# Patient Record
Sex: Female | Born: 1980
Health system: Southern US, Community
[De-identification: ages and names within clinical notes are randomized; demographics above are authoritative.]

## PROBLEM LIST (undated history)

## (undated) DIAGNOSIS — Z9851 Tubal ligation status: Secondary | ICD-10-CM

## (undated) DIAGNOSIS — F419 Anxiety disorder, unspecified: Secondary | ICD-10-CM

## (undated) DIAGNOSIS — Z98891 History of uterine scar from previous surgery: Secondary | ICD-10-CM

## (undated) DIAGNOSIS — Z789 Other specified health status: Secondary | ICD-10-CM

## (undated) DIAGNOSIS — G4733 Obstructive sleep apnea (adult) (pediatric): Secondary | ICD-10-CM

## (undated) HISTORY — DX: Anxiety disorder, unspecified: F41.9

## (undated) HISTORY — DX: Obstructive sleep apnea (adult) (pediatric): G47.33

---

## 2008-03-07 HISTORY — PX: TYMPANOPLASTY: SHX33

## 2009-10-17 ENCOUNTER — Inpatient Hospital Stay (HOSPITAL_COMMUNITY): Admission: AD | Admit: 2009-10-17 | Discharge: 2009-10-17 | Payer: Self-pay | Admitting: Obstetrics & Gynecology

## 2009-10-17 ENCOUNTER — Ambulatory Visit: Payer: Self-pay | Admitting: Obstetrics and Gynecology

## 2010-05-21 LAB — CBC
Hemoglobin: 12.9 g/dL (ref 12.0–15.0)
MCHC: 32.7 g/dL (ref 30.0–36.0)
RBC: 4.5 MIL/uL (ref 3.87–5.11)

## 2010-05-21 LAB — URINALYSIS, ROUTINE W REFLEX MICROSCOPIC
Glucose, UA: NEGATIVE mg/dL
Ketones, ur: NEGATIVE mg/dL
Leukocytes, UA: NEGATIVE
Nitrite: NEGATIVE
Specific Gravity, Urine: 1.025 (ref 1.005–1.030)
Urobilinogen, UA: 0.2 mg/dL (ref 0.0–1.0)
pH: 6.5 (ref 5.0–8.0)

## 2010-05-21 LAB — WET PREP, GENITAL
Trich, Wet Prep: NONE SEEN
Yeast Wet Prep HPF POC: NONE SEEN

## 2010-06-04 ENCOUNTER — Other Ambulatory Visit: Payer: Self-pay | Admitting: Family Medicine

## 2010-06-04 DIAGNOSIS — R51 Headache: Secondary | ICD-10-CM

## 2010-06-11 ENCOUNTER — Ambulatory Visit
Admission: RE | Admit: 2010-06-11 | Discharge: 2010-06-11 | Disposition: A | Discharge status: Home / Self Care | Payer: Managed Care, Other (non HMO) | Source: Ambulatory Visit | Attending: Family Medicine | Admitting: Family Medicine

## 2010-06-11 DIAGNOSIS — R51 Headache: Secondary | ICD-10-CM

## 2010-06-11 MED ORDER — IOHEXOL 300 MG/ML  SOLN
75.0000 mL | Freq: Once | INTRAMUSCULAR | Status: AC | PRN
  Administered 2010-06-11: 75 mL via INTRAVENOUS

## 2011-04-25 LAB — PULMONARY FUNCTION TEST

## 2011-11-28 LAB — OB RESULTS CONSOLE ABO/RH: RH Type: POSITIVE

## 2011-11-28 LAB — OB RESULTS CONSOLE RPR: RPR: NONREACTIVE

## 2011-11-28 LAB — OB RESULTS CONSOLE HIV ANTIBODY (ROUTINE TESTING): HIV: NONREACTIVE

## 2012-01-28 IMAGING — CT CT HEAD WO/W CM
1 of 2 series · 14 of 30 positions shown, 18 images · IV contrast (75CC OMNI 300)
Comparison: None.

CLINICAL DATA: 1 month persistent left temporal headache with left
mandibular pain/numbness.  Bell's palsy.

CT HEAD WITHOUT AND WITH CONTRAST
TECHNIQUE: Contiguous axial images were obtained from the base of
the skull through the vertex without and with intravenous contrast.
Contrast: Pre and post intravenous 75 ml [id number]-[id number].

[Series 2: head w/o · axial · non-contrast · 0.43mm/px · z∈[+23,+147]mm · 14 of 32 slices shown, 18 images]
[im 3/32  brain]
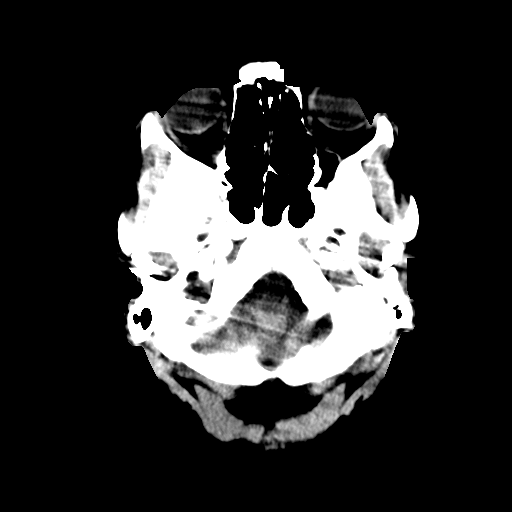
[im 3/32  bone]
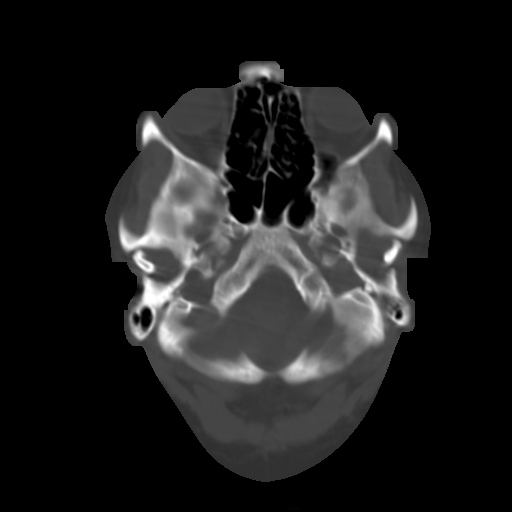
[im 5/32  brain]
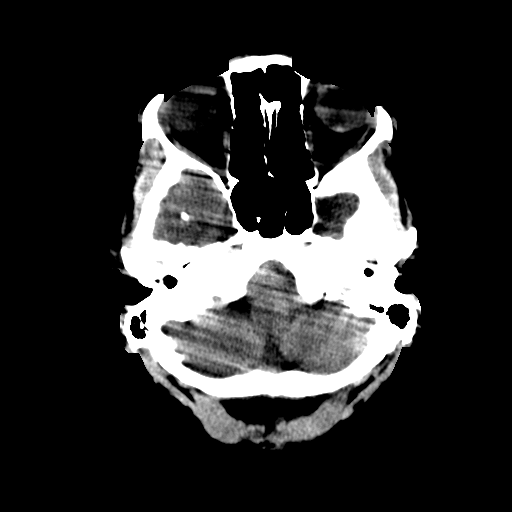
[im 7/32  brain]
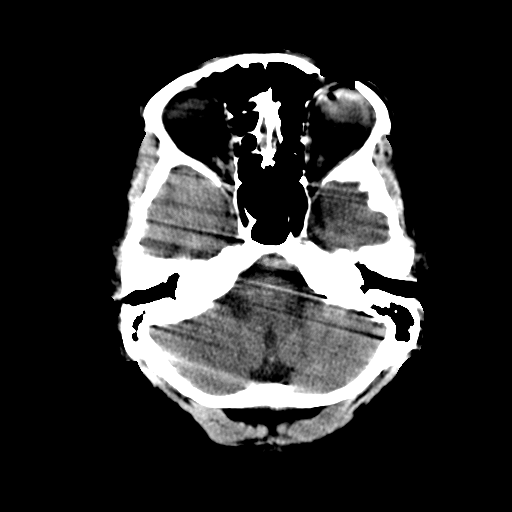
[im 9/32  brain]
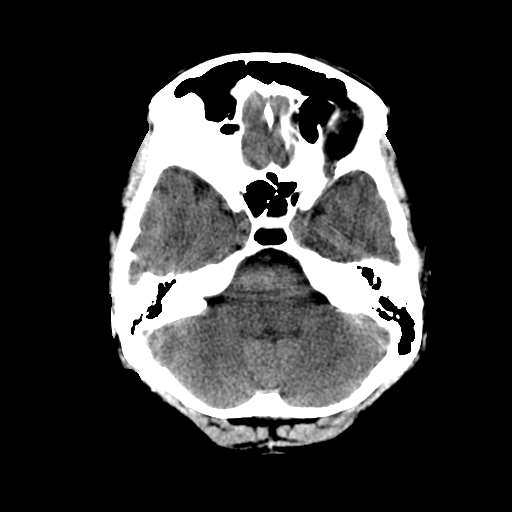
[im 11/32  brain]
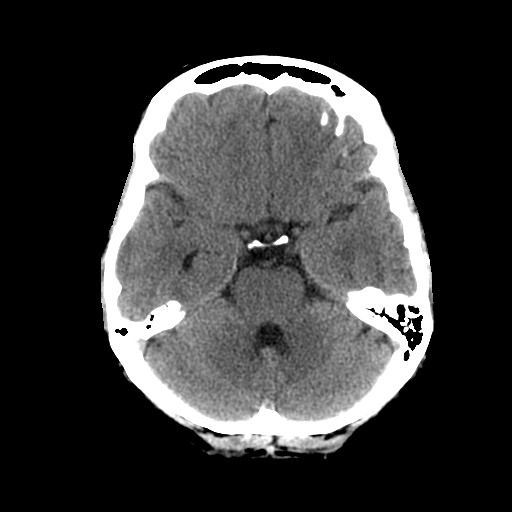
[im 11/32  bone]
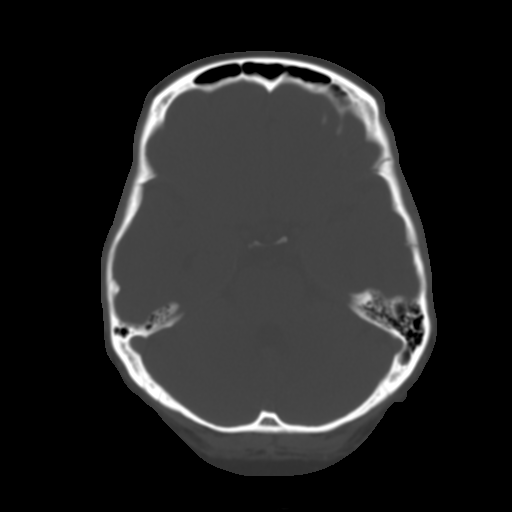
[im 13/32  brain]
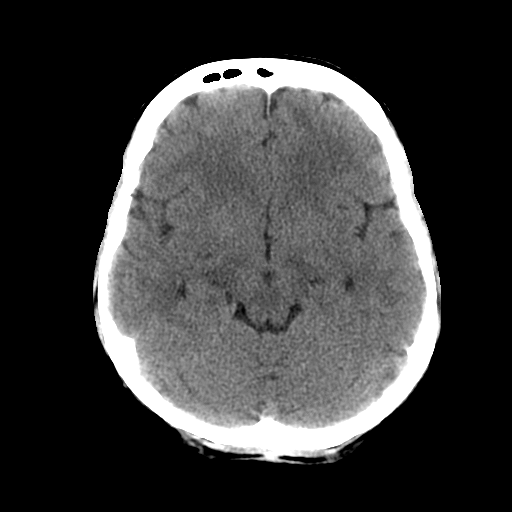
[im 15/32  brain]
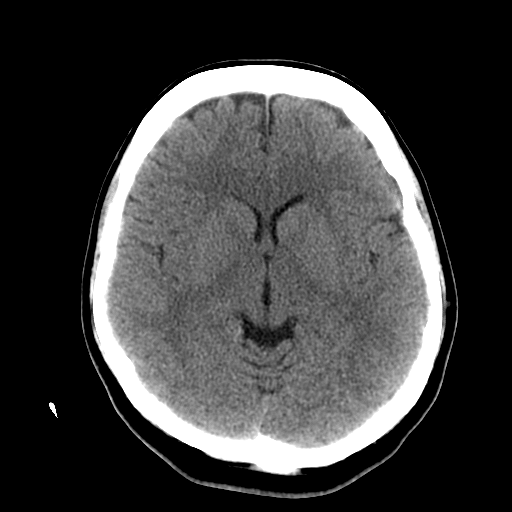
[im 17/32  brain]
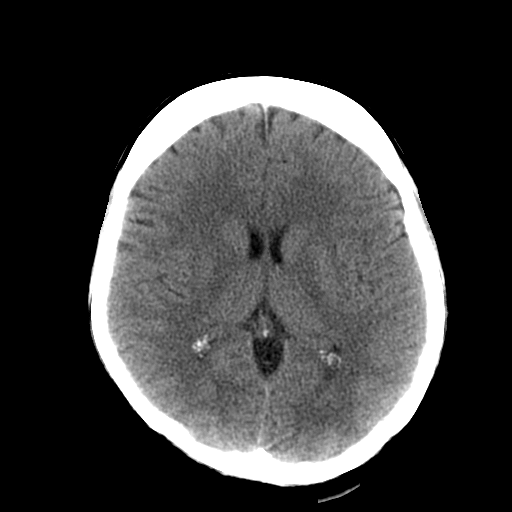
[im 19/32  brain]
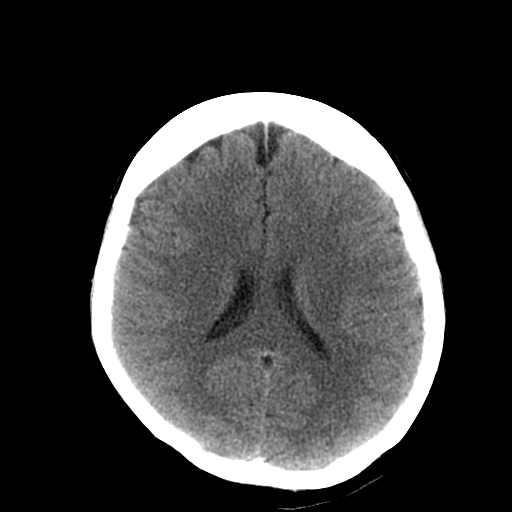
[im 19/32  bone]
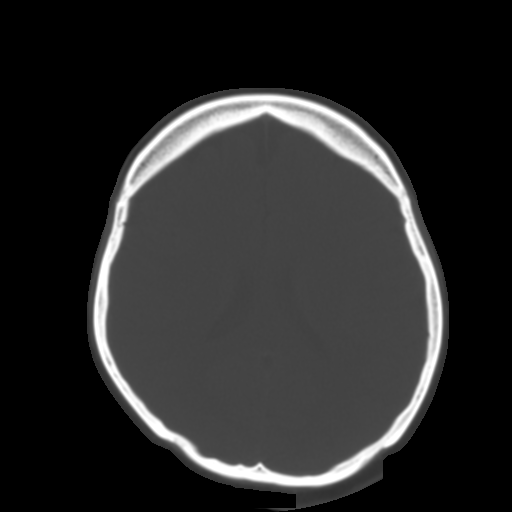
[im 21/32  brain]
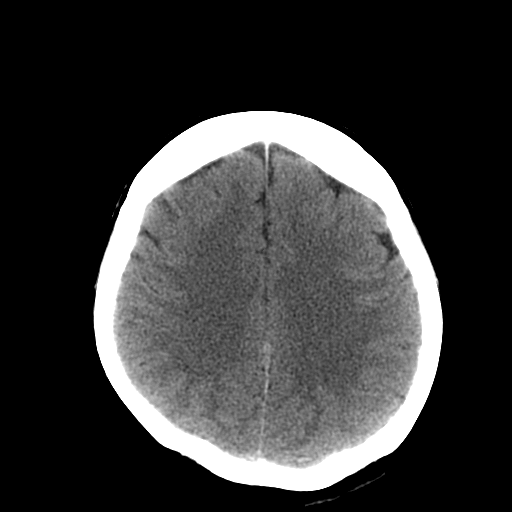
[im 23/32  brain]
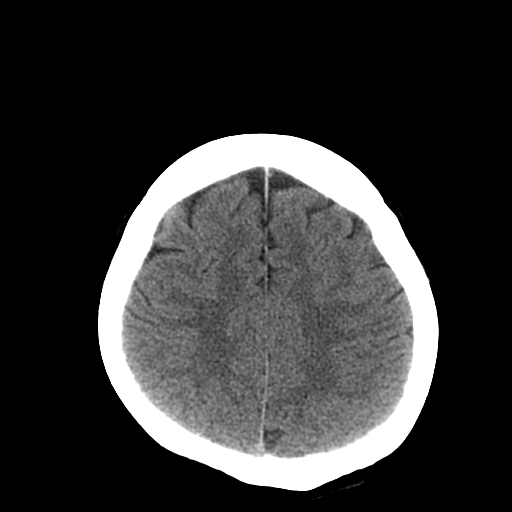
[im 25/32  brain]
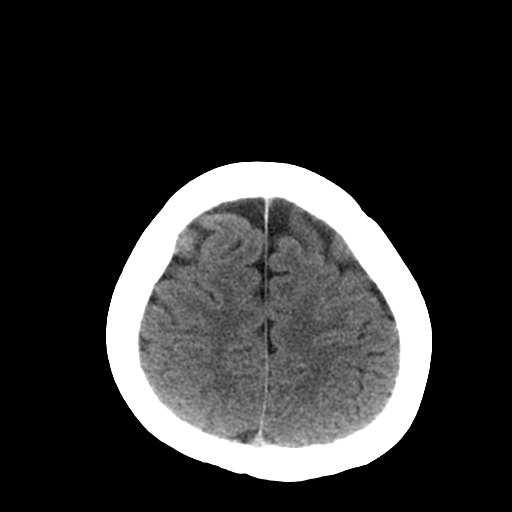
[im 27/32  brain]
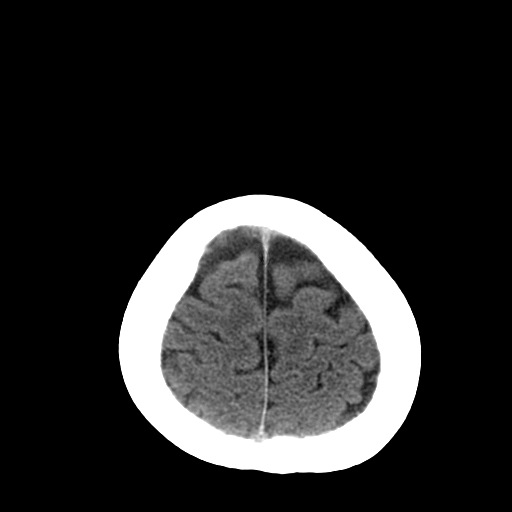
[im 27/32  bone]
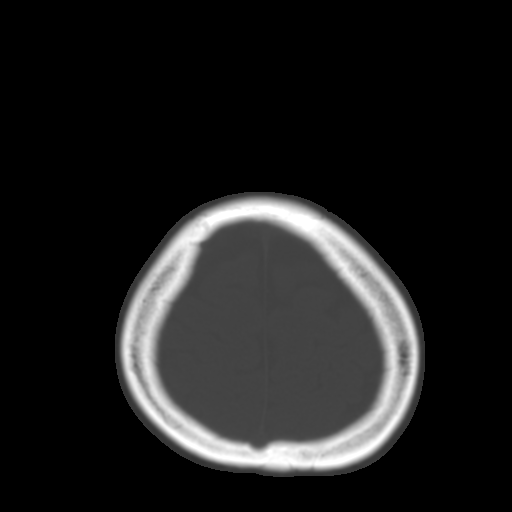
[im 29/32  brain]
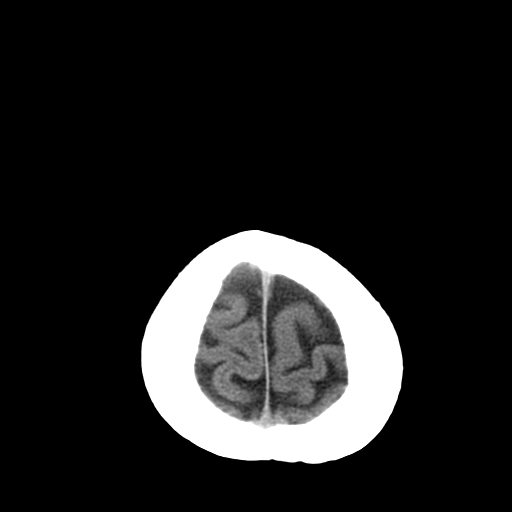

[14 of 30 positions shown; findings below may reference images not displayed]

FINDINGS: Cerebrum, cerebral ventricles, brainstem, and cerebellum
appear normal. (Specifically no acute intracerebral edema,
hemorrhage, tumor, mass effect/midline shift, acute extra-axial
hemorrhage/collection).

Vasculature unremarkable.

Calvarium appears normal.
IMPRESSION: Normal.

## 2012-05-03 ENCOUNTER — Other Ambulatory Visit: Payer: Self-pay | Admitting: Obstetrics & Gynecology

## 2012-05-31 ENCOUNTER — Encounter (HOSPITAL_COMMUNITY): Payer: Self-pay | Admitting: Pharmacist

## 2012-06-06 ENCOUNTER — Encounter (HOSPITAL_COMMUNITY): Payer: Self-pay

## 2012-06-06 ENCOUNTER — Encounter (HOSPITAL_COMMUNITY)
Admission: RE | Admit: 2012-06-06 | Discharge: 2012-06-06 | Disposition: A | Discharge status: Home / Self Care | Payer: Managed Care, Other (non HMO) | Source: Ambulatory Visit | Attending: Obstetrics & Gynecology | Admitting: Obstetrics & Gynecology

## 2012-06-06 HISTORY — DX: Other specified health status: Z78.9

## 2012-06-06 LAB — CBC
HCT: 38.3 % (ref 36.0–46.0)
MCHC: 32.6 g/dL (ref 30.0–36.0)
MCV: 89.1 fL (ref 78.0–100.0)
WBC: 7.8 10*3/uL (ref 4.0–10.5)

## 2012-06-06 LAB — TYPE AND SCREEN: ABO/RH(D): B POS

## 2012-06-06 LAB — ABO/RH: ABO/RH(D): B POS

## 2012-06-06 NOTE — Patient Instructions (Addendum)
20 [name]  06/06/2012   Your procedure is scheduled on:  06/08/12  Enter through the Main Entrance of [facility] at 1130 AM.  Pick up the phone at the desk and dial [phone].   Call this number if you have problems the morning of surgery: [phone]   Remember:   Do not eat food:After Midnight.  Do not drink clear liquids: 4 Hours before arrival.  Take these medicines the morning of surgery with A SIP OF WATER: NA   Do not wear jewelry, make-up or nail polish.  Do not wear lotions, powders, or perfumes. You may wear deodorant.  Do not shave 48 hours prior to surgery.  Do not bring valuables to the hospital.  Contacts, dentures or bridgework may not be worn into surgery.  Leave suitcase in the car. After surgery it may be brought to your room.  For patients admitted to the hospital, checkout time is 11:00 AM the day of discharge.   Patients discharged the day of surgery will not be allowed to drive home.  Name and phone number of your driver: NA  Special Instructions: Shower using CHG 2 nights before surgery and the night before surgery.  If you shower the day of surgery use CHG.  Use special wash - you have one bottle of CHG for all showers.  You should use approximately 1/3 of the bottle for each shower.   Please read over the following fact sheets that you were given: Surgical Site Infection Prevention

## 2012-06-08 ENCOUNTER — Inpatient Hospital Stay (HOSPITAL_COMMUNITY): Payer: Managed Care, Other (non HMO) | Admitting: Anesthesiology

## 2012-06-08 ENCOUNTER — Encounter (HOSPITAL_COMMUNITY): Payer: Self-pay | Admitting: *Deleted

## 2012-06-08 ENCOUNTER — Encounter (HOSPITAL_COMMUNITY)
Admission: RE | Disposition: A | Discharge status: Home / Self Care | Payer: Self-pay | Source: Ambulatory Visit | Attending: Obstetrics & Gynecology

## 2012-06-08 ENCOUNTER — Inpatient Hospital Stay (HOSPITAL_COMMUNITY)
Admission: RE | Admit: 2012-06-08 | Discharge: 2012-06-11 | DRG: 766 | Disposition: A | Discharge status: Home / Self Care | Payer: Managed Care, Other (non HMO) | Source: Ambulatory Visit | Attending: Obstetrics & Gynecology | Admitting: Obstetrics & Gynecology

## 2012-06-08 ENCOUNTER — Encounter (HOSPITAL_COMMUNITY): Payer: Self-pay | Admitting: Anesthesiology

## 2012-06-08 DIAGNOSIS — Z98891 History of uterine scar from previous surgery: Secondary | ICD-10-CM

## 2012-06-08 DIAGNOSIS — O30039 Twin pregnancy, monochorionic/diamniotic, unspecified trimester: Secondary | ICD-10-CM

## 2012-06-08 DIAGNOSIS — R4182 Altered mental status, unspecified: Secondary | ICD-10-CM | POA: Diagnosis not present

## 2012-06-08 DIAGNOSIS — Z91040 Latex allergy status: Secondary | ICD-10-CM

## 2012-06-08 DIAGNOSIS — O34219 Maternal care for unspecified type scar from previous cesarean delivery: Principal | ICD-10-CM | POA: Diagnosis present

## 2012-06-08 DIAGNOSIS — Z302 Encounter for sterilization: Secondary | ICD-10-CM

## 2012-06-08 DIAGNOSIS — Z9851 Tubal ligation status: Secondary | ICD-10-CM

## 2012-06-08 DIAGNOSIS — O3090 Multiple gestation, unspecified, unspecified trimester: Secondary | ICD-10-CM | POA: Diagnosis present

## 2012-06-08 DIAGNOSIS — O329XX0 Maternal care for malpresentation of fetus, unspecified, not applicable or unspecified: Secondary | ICD-10-CM | POA: Diagnosis present

## 2012-06-08 DIAGNOSIS — O99893 Other specified diseases and conditions complicating puerperium: Secondary | ICD-10-CM | POA: Diagnosis not present

## 2012-06-08 HISTORY — DX: History of uterine scar from previous surgery: Z98.891

## 2012-06-08 HISTORY — DX: Tubal ligation status: Z98.51

## 2012-06-08 LAB — COMPREHENSIVE METABOLIC PANEL
ALT: 13 U/L (ref 0–35)
AST: 24 U/L (ref 0–37)
Alkaline Phosphatase: 139 U/L — ABNORMAL HIGH (ref 39–117)
CO2: 24 mEq/L (ref 19–32)
Chloride: 98 mEq/L (ref 96–112)
GFR calc Af Amer: >90 mL/min (ref >90)
GFR calc non Af Amer: >90 mL/min (ref >90)
Glucose, Bld: 84 mg/dL (ref 70–99)
Potassium: 4.3 mEq/L (ref 3.5–5.1)
Sodium: 127 mEq/L — ABNORMAL LOW (ref 135–145)
Total Bilirubin: 0.4 mg/dL (ref 0.3–1.2)

## 2012-06-08 LAB — CBC
Hemoglobin: 11.1 g/dL — ABNORMAL LOW (ref 12.0–15.0)
MCH: 29.4 pg (ref 26.0–34.0)
Platelets: 123 10*3/uL — ABNORMAL LOW (ref 150–400)
RBC: 3.77 MIL/uL — ABNORMAL LOW (ref 3.87–5.11)
WBC: 10.6 10*3/uL — ABNORMAL HIGH (ref 4.0–10.5)

## 2012-06-08 SURGERY — Surgical Case
Anesthesia: Spinal | Site: Abdomen | Wound class: Clean Contaminated

## 2012-06-08 MED ORDER — PHENYLEPHRINE 40 MCG/ML (10ML) SYRINGE FOR IV PUSH (FOR BLOOD PRESSURE SUPPORT)
PREFILLED_SYRINGE | INTRAVENOUS | Status: AC
  Filled 2012-06-08: qty 5

## 2012-06-08 MED ORDER — NALOXONE HCL 0.4 MG/ML IJ SOLN: 0.4000 mg | INTRAMUSCULAR | Status: DC | PRN

## 2012-06-08 MED ORDER — BUPIVACAINE IN DEXTROSE 0.75-8.25 % IT SOLN
INTRATHECAL | Status: DC | PRN
  Administered 2012-06-08: 1.6 mL via INTRATHECAL

## 2012-06-08 MED ORDER — DIPHENHYDRAMINE HCL 50 MG/ML IJ SOLN: 12.5000 mg | INTRAMUSCULAR | Status: DC | PRN

## 2012-06-08 MED ORDER — KETOROLAC TROMETHAMINE 30 MG/ML IJ SOLN: 30.0000 mg | Freq: Four times a day (QID) | INTRAMUSCULAR | Status: AC | PRN

## 2012-06-08 MED ORDER — SCOPOLAMINE 1 MG/3DAYS TD PT72: 1.0000 | MEDICATED_PATCH | Freq: Once | TRANSDERMAL | Status: DC

## 2012-06-08 MED ORDER — ZOLPIDEM TARTRATE 5 MG PO TABS: 5.0000 mg | ORAL_TABLET | Freq: Every evening | ORAL | Status: DC | PRN

## 2012-06-08 MED ORDER — OXYTOCIN 10 UNIT/ML IJ SOLN
INTRAMUSCULAR | Status: AC
  Filled 2012-06-08: qty 4

## 2012-06-08 MED ORDER — FENTANYL CITRATE 0.05 MG/ML IJ SOLN
INTRAMUSCULAR | Status: DC | PRN
  Administered 2012-06-08: 25 ug via INTRATHECAL

## 2012-06-08 MED ORDER — EPHEDRINE 5 MG/ML INJ
INTRAVENOUS | Status: AC
  Filled 2012-06-08: qty 10

## 2012-06-08 MED ORDER — TETANUS-DIPHTH-ACELL PERTUSSIS 5-2.5-18.5 LF-MCG/0.5 IM SUSP: 0.5000 mL | Freq: Once | INTRAMUSCULAR | Status: DC

## 2012-06-08 MED ORDER — SIMETHICONE 80 MG PO CHEW
80.0000 mg | CHEWABLE_TABLET | Freq: Three times a day (TID) | ORAL | Status: DC
  Administered 2012-06-09 – 2012-06-11 (×7): 80 mg via ORAL

## 2012-06-08 MED ORDER — KETOROLAC TROMETHAMINE 30 MG/ML IJ SOLN
INTRAMUSCULAR | Status: AC
  Administered 2012-06-08: 30 mg via INTRAMUSCULAR
  Filled 2012-06-08: qty 1

## 2012-06-08 MED ORDER — ONDANSETRON HCL 4 MG/2ML IJ SOLN
INTRAMUSCULAR | Status: AC
  Filled 2012-06-08: qty 2

## 2012-06-08 MED ORDER — ONDANSETRON HCL 4 MG/2ML IJ SOLN
INTRAMUSCULAR | Status: DC | PRN
  Administered 2012-06-08: 4 mg via INTRAVENOUS

## 2012-06-08 MED ORDER — LANOLIN HYDROUS EX OINT: 1.0000 "application " | TOPICAL_OINTMENT | CUTANEOUS | Status: DC | PRN

## 2012-06-08 MED ORDER — MORPHINE SULFATE (PF) 0.5 MG/ML IJ SOLN
INTRAMUSCULAR | Status: DC | PRN
  Administered 2012-06-08: .1 mg via INTRATHECAL

## 2012-06-08 MED ORDER — NALBUPHINE HCL 10 MG/ML IJ SOLN
5.0000 mg | INTRAMUSCULAR | Status: DC | PRN
  Filled 2012-06-08: qty 1

## 2012-06-08 MED ORDER — FENTANYL CITRATE 0.05 MG/ML IJ SOLN: 25.0000 ug | INTRAMUSCULAR | Status: DC | PRN

## 2012-06-08 MED ORDER — MIDAZOLAM HCL 2 MG/2ML IJ SOLN
0.5000 mg | Freq: Once | INTRAMUSCULAR | Status: DC | PRN
Start: 2012-06-08 — End: 2012-06-08

## 2012-06-08 MED ORDER — ONDANSETRON HCL 4 MG PO TABS: 4.0000 mg | ORAL_TABLET | ORAL | Status: DC | PRN

## 2012-06-08 MED ORDER — DIBUCAINE 1 % RE OINT: 1.0000 "application " | TOPICAL_OINTMENT | RECTAL | Status: DC | PRN

## 2012-06-08 MED ORDER — OXYTOCIN 40 UNITS IN LACTATED RINGERS INFUSION - SIMPLE MED: 62.5000 mL/h | INTRAVENOUS | Status: AC

## 2012-06-08 MED ORDER — PROMETHAZINE HCL 25 MG/ML IJ SOLN: 6.2500 mg | INTRAMUSCULAR | Status: DC | PRN

## 2012-06-08 MED ORDER — SENNOSIDES-DOCUSATE SODIUM 8.6-50 MG PO TABS
2.0000 | ORAL_TABLET | Freq: Every day | ORAL | Status: DC
  Administered 2012-06-08 – 2012-06-10 (×2): 2 via ORAL

## 2012-06-08 MED ORDER — NALOXONE HCL 1 MG/ML IJ SOLN: 1.0000 ug/kg/h | INTRAVENOUS | Status: DC | PRN

## 2012-06-08 MED ORDER — DIPHENHYDRAMINE HCL 25 MG PO CAPS: 25.0000 mg | ORAL_CAPSULE | ORAL | Status: DC | PRN

## 2012-06-08 MED ORDER — PHENYLEPHRINE HCL 10 MG/ML IJ SOLN
INTRAMUSCULAR | Status: DC | PRN
  Administered 2012-06-08: 160 ug via INTRAVENOUS
  Administered 2012-06-08: 80 ug via INTRAVENOUS
  Administered 2012-06-08: 40 ug via INTRAVENOUS

## 2012-06-08 MED ORDER — OXYTOCIN 10 UNIT/ML IJ SOLN
40.0000 [IU] | INTRAVENOUS | Status: DC | PRN
  Administered 2012-06-08: 40 [IU] via INTRAVENOUS

## 2012-06-08 MED ORDER — MORPHINE SULFATE 0.5 MG/ML IJ SOLN
INTRAMUSCULAR | Status: AC
  Filled 2012-06-08: qty 10

## 2012-06-08 MED ORDER — ONDANSETRON HCL 4 MG/2ML IJ SOLN: 4.0000 mg | INTRAMUSCULAR | Status: DC | PRN

## 2012-06-08 MED ORDER — DIPHENHYDRAMINE HCL 50 MG/ML IJ SOLN: 25.0000 mg | INTRAMUSCULAR | Status: DC | PRN

## 2012-06-08 MED ORDER — NALBUPHINE SYRINGE 5 MG/0.5 ML
INJECTION | INTRAMUSCULAR | Status: AC
  Administered 2012-06-08: 10 mg via SUBCUTANEOUS
  Filled 2012-06-08: qty 1

## 2012-06-08 MED ORDER — ONDANSETRON HCL 4 MG/2ML IJ SOLN: 4.0000 mg | Freq: Three times a day (TID) | INTRAMUSCULAR | Status: DC | PRN

## 2012-06-08 MED ORDER — SCOPOLAMINE 1 MG/3DAYS TD PT72
MEDICATED_PATCH | TRANSDERMAL | Status: AC
  Filled 2012-06-08: qty 1

## 2012-06-08 MED ORDER — MENTHOL 3 MG MT LOZG: 1.0000 | LOZENGE | OROMUCOSAL | Status: DC | PRN

## 2012-06-08 MED ORDER — MEPERIDINE HCL 25 MG/ML IJ SOLN: 6.2500 mg | INTRAMUSCULAR | Status: DC | PRN

## 2012-06-08 MED ORDER — WITCH HAZEL-GLYCERIN EX PADS: 1.0000 "application " | MEDICATED_PAD | CUTANEOUS | Status: DC | PRN

## 2012-06-08 MED ORDER — FENTANYL CITRATE 0.05 MG/ML IJ SOLN
INTRAMUSCULAR | Status: AC
Start: 2012-06-08 — End: 2012-06-08
  Filled 2012-06-08: qty 2

## 2012-06-08 MED ORDER — CEFAZOLIN SODIUM-DEXTROSE 2-3 GM-% IV SOLR
2.0000 g | INTRAVENOUS | Status: AC
  Administered 2012-06-08: 2 g via INTRAVENOUS

## 2012-06-08 MED ORDER — SIMETHICONE 80 MG PO CHEW
80.0000 mg | CHEWABLE_TABLET | ORAL | Status: DC | PRN
  Administered 2012-06-08: 80 mg via ORAL

## 2012-06-08 MED ORDER — ACETAMINOPHEN 10 MG/ML IV SOLN: 1000.0000 mg | Freq: Four times a day (QID) | INTRAVENOUS | Status: AC | PRN

## 2012-06-08 MED ORDER — CEFAZOLIN SODIUM-DEXTROSE 2-3 GM-% IV SOLR
INTRAVENOUS | Status: AC
  Filled 2012-06-08: qty 50

## 2012-06-08 MED ORDER — SODIUM CHLORIDE 0.9 % IJ SOLN: 3.0000 mL | INTRAMUSCULAR | Status: DC | PRN

## 2012-06-08 MED ORDER — METOCLOPRAMIDE HCL 5 MG/ML IJ SOLN: 10.0000 mg | Freq: Three times a day (TID) | INTRAMUSCULAR | Status: DC | PRN

## 2012-06-08 MED ORDER — LACTATED RINGERS IV SOLN
INTRAVENOUS | Status: DC
  Administered 2012-06-08 (×6): via INTRAVENOUS

## 2012-06-08 MED ORDER — IBUPROFEN 600 MG PO TABS
600.0000 mg | ORAL_TABLET | Freq: Four times a day (QID) | ORAL | Status: DC
  Administered 2012-06-09 – 2012-06-11 (×11): 600 mg via ORAL
  Filled 2012-06-08 (×11): qty 1

## 2012-06-08 MED ORDER — OXYCODONE-ACETAMINOPHEN 5-325 MG PO TABS
1.0000 | ORAL_TABLET | ORAL | Status: DC | PRN
  Administered 2012-06-09 – 2012-06-11 (×5): 1 via ORAL
  Filled 2012-06-08 (×5): qty 1

## 2012-06-08 MED ORDER — PRENATAL MULTIVITAMIN CH
1.0000 | ORAL_TABLET | Freq: Every day | ORAL | Status: DC
  Administered 2012-06-09 – 2012-06-11 (×3): 1 via ORAL
  Filled 2012-06-08 (×3): qty 1

## 2012-06-08 MED ORDER — SCOPOLAMINE 1 MG/3DAYS TD PT72
MEDICATED_PATCH | TRANSDERMAL | Status: AC
  Administered 2012-06-08: 1.5 mg via TRANSDERMAL
  Filled 2012-06-08: qty 1

## 2012-06-08 MED ORDER — LACTATED RINGERS IV SOLN
INTRAVENOUS | Status: DC
  Administered 2012-06-08 – 2012-06-09 (×3): via INTRAVENOUS

## 2012-06-08 MED ORDER — DIPHENHYDRAMINE HCL 25 MG PO CAPS: 25.0000 mg | ORAL_CAPSULE | Freq: Four times a day (QID) | ORAL | Status: DC | PRN

## 2012-06-08 SURGICAL SUPPLY — 39 items
BENZOIN TINCTURE PRP APPL 2/3 (GAUZE/BANDAGES/DRESSINGS) ×2 IMPLANT
CLOTH BEACON ORANGE TIMEOUT ST (SAFETY) ×2 IMPLANT
CONTAINER PREFILL 10% NBF 15ML (MISCELLANEOUS) IMPLANT
DRAPE LG THREE QUARTER DISP (DRAPES) ×2 IMPLANT
DRSG OPSITE 6X11 MED (GAUZE/BANDAGES/DRESSINGS) ×2 IMPLANT
DRSG OPSITE POSTOP 4X10 (GAUZE/BANDAGES/DRESSINGS) IMPLANT
DURAPREP 26ML APPLICATOR (WOUND CARE) ×2 IMPLANT
ELECT REM PT RETURN 9FT ADLT (ELECTROSURGICAL) ×2
ELECTRODE REM PT RTRN 9FT ADLT (ELECTROSURGICAL) ×1 IMPLANT
EXTRACTOR VACUUM KIWI (MISCELLANEOUS) IMPLANT
EXTRACTOR VACUUM M CUP 4 TUBE (SUCTIONS) IMPLANT
GLOVE BIO SURGEON STRL SZ7 (GLOVE) ×2 IMPLANT
GLOVE BIOGEL PI IND STRL 7.0 (GLOVE) ×1 IMPLANT
GLOVE BIOGEL PI INDICATOR 7.0 (GLOVE) ×1
GOWN STRL REIN XL XLG (GOWN DISPOSABLE) ×4 IMPLANT
KIT ABG SYR 3ML LUER SLIP (SYRINGE) IMPLANT
NEEDLE HYPO 25X5/8 SAFETYGLIDE (NEEDLE) IMPLANT
NS IRRIG 1000ML POUR BTL (IV SOLUTION) ×2 IMPLANT
PACK C SECTION WH (CUSTOM PROCEDURE TRAY) ×2 IMPLANT
PAD OB MATERNITY 4.3X12.25 (PERSONAL CARE ITEMS) ×2 IMPLANT
RTRCTR C-SECT PINK 25CM LRG (MISCELLANEOUS) IMPLANT
SLEEVE SCD COMPRESS KNEE MED (MISCELLANEOUS) IMPLANT
STAPLER VISISTAT 35W (STAPLE) IMPLANT
STRIP CLOSURE SKIN 1/2X4 (GAUZE/BANDAGES/DRESSINGS) ×2 IMPLANT
STRIP CLOSURE SKIN 1/4X4 (GAUZE/BANDAGES/DRESSINGS) IMPLANT
SUT MNCRL 0 VIOLET CTX 36 (SUTURE) ×3 IMPLANT
SUT MONOCRYL 0 CTX 36 (SUTURE) ×3
SUT PLAIN 0 NONE (SUTURE) ×2 IMPLANT
SUT PLAIN 2 0 (SUTURE) ×1
SUT PLAIN ABS 2-0 CT1 27XMFL (SUTURE) ×1 IMPLANT
SUT VIC AB 0 CT1 27 (SUTURE) ×2
SUT VIC AB 0 CT1 27XBRD ANBCTR (SUTURE) ×2 IMPLANT
SUT VIC AB 2-0 CT1 27 (SUTURE) ×2
SUT VIC AB 2-0 CT1 TAPERPNT 27 (SUTURE) ×2 IMPLANT
SUT VIC AB 4-0 KS 27 (SUTURE) ×2 IMPLANT
SUT VICRYL 0 TIES 12 18 (SUTURE) IMPLANT
TOWEL OR 17X24 6PK STRL BLUE (TOWEL DISPOSABLE) ×6 IMPLANT
TRAY FOLEY CATH 14FR (SET/KITS/TRAYS/PACK) IMPLANT
WATER STERILE IRR 1000ML POUR (IV SOLUTION) IMPLANT

## 2012-06-08 NOTE — H&P (Signed)
[name] is a 32 y.o. female G2P1001 at 37.4 wks, for repeat c/s and bilateral tubal ligation Mono-Di twins, good growth for both, not discordant, was getting weekly ANtesting, BPP 8/8 1st trim bleeding, large SCH, but resolved.  Normal labs and overall course uncomplicated  History OB History   Grav Para Term Preterm Abortions TAB SAB Ect Mult Living   2 1        1      Past Medical History  Diagnosis Date  . Medical history non-contributory    Past Surgical History  Procedure Laterality Date  . Cesarean section  2006  . Tympanoplasty     Family History: family history is not on file. Social History:  reports that she has never smoked. She does not have any smokeless tobacco history on file. She reports that she does not drink alcohol or use illicit drugs.   Prenatal Transfer Tool  Maternal Diabetes: No Genetic Screening: Normal Maternal Ultrasounds/Referrals: Normal Fetal Ultrasounds or other Referrals:  None Maternal Substance Abuse:  No Significant Maternal Medications:  None Significant Maternal Lab Results:  Lab values include: Group B Strep negative Other Comments:  None  ROS  Neg      Blood pressure 127/88, pulse 98, temperature 97.9 F (36.6 C), temperature source Oral, resp. rate 20, SpO2 100.00%. Exam Physical Exam  Physical exam:  A&O x 3, no acute distress. Pleasant HEENT neg, no thyromegaly Lungs CTA bilat CV RRR, S1S2 normal Abdo soft, non tender, non acute, gravid with twins Extr no edema/ tenderness Pelvic deferred FHT  Present x 2  Prenatal labs: ABO, Rh: --/--/B POS (04/02 0850) Antibody: NEG (04/02 0846) Rubella: Immune (09/23 0810) RPR: NON REACTIVE (04/02 0846)  HBsAg: Negative (09/23 0810)  HIV: Non-reactive (09/23 0810)  GBS:   neg   Assessment/Plan: 32 yo, G2P1001, 37.2 wks, Mono-Di twins, good growth. Here for repeat C/s. Wants permanent sterilization. Understands risk of regret, failure, ectopic.  Risks/complications of  surgery reviewed incl infection, bleeding, damage to internal organs including bladder, bowels, ureters, blood vessels, other risks from anesthesia, VTE and delayed complications of any surgery, complications in future surgery reviewed. Also discussed neonatal complications incl difficult delivery, laceration, vacuum assistance, TTN etc. Pt understands and agrees, all concerns addressed.     [name] R 06/08/2012, 1:05 PM

## 2012-06-08 NOTE — Transfer of Care (Signed)
Immediate Anesthesia Transfer of Care Note  Patient: [name]  Procedure(s) Performed: Procedure(s) with comments: CESAREAN SECTION (N/A) - Repeat C/S  EDD: 06/24/12;Twins Monochorionic diamniotic  Patient Location: PACU  Anesthesia Type:Spinal  Level of Consciousness: awake, alert  and oriented  Airway & Oxygen Therapy: Patient Spontanous Breathing  Post-op Assessment: Report given to PACU RN and Post -op Vital signs reviewed and stable  Post vital signs: Reviewed and stable  Complications: No apparent anesthesia complications

## 2012-06-08 NOTE — Progress Notes (Signed)
Noted Patient to be slightly disoriented during assessment. Patient saying things like "I need to go outside and water the flowers". Vital signs WNL. Patient output blood tinged. Honeycomb dressing has a moderate amount of marked drainage. No other symptoms as this time. Nurse and Husband very concerned about patient and her behavior. Notified Dr. Ernestina Penna with all above symptoms. New orders in computer as ordered. Will continue to monitor.

## 2012-06-08 NOTE — Anesthesia Preprocedure Evaluation (Signed)

## 2012-06-08 NOTE — Anesthesia Procedure Notes (Signed)
Spinal  Patient location during procedure: OR Start time: 06/08/2012 1:25 PM Staffing Anesthesiologist: Angus Seller., Harrell Gave. Performed by: anesthesiologist  Preanesthetic Checklist Completed: patient identified, site marked, surgical consent, pre-op evaluation, timeout performed, IV checked, risks and benefits discussed and monitors and equipment checked Spinal Block Patient position: sitting Prep: DuraPrep Patient monitoring: heart rate, cardiac monitor, continuous pulse ox and blood pressure Approach: midline Location: L3-4 Injection technique: single-shot Needle Needle type: Sprotte  Needle gauge: 24 G Needle length: 9 cm Assessment Sensory level: T4 Additional Notes Patient identified.  Risk benefits discussed including failed block, incomplete pain control, headache, nerve damage, paralysis, blood pressure changes, nausea, vomiting, reactions to medication both toxic or allergic, and postpartum back pain.  Patient expressed understanding and wished to proceed.  All questions were answered.  Sterile technique used throughout procedure.  CSF was clear.  No parasthesia or other complications.  Please see nursing notes for vital signs.

## 2012-06-08 NOTE — Anesthesia Postprocedure Evaluation (Signed)
  Anesthesia Post-op Note  Patient: [name]  Procedure(s) Performed: Procedure(s) with comments: CESAREAN SECTION (N/A) - Repeat C/S  EDD: 06/24/12;Twins Monochorionic diamniotic  Patient Location: Mother/Baby  Anesthesia Type:Spinal  Level of Consciousness: awake  Airway and Oxygen Therapy: Patient Spontanous Breathing  Post-op Pain: mild  Post-op Assessment: Patient's Cardiovascular Status Stable and Respiratory Function Stable  Post-op Vital Signs: stable  Complications: No apparent anesthesia complications

## 2012-06-08 NOTE — Op Note (Signed)
Cesarean Section Procedure Note [name]  06/08/2012  Indications: Prior cesarean section, Monochorionic/ Diamniotic twins, Breech/ transverse lie 37 wks  Procedure Note: Repeat Low Transverse Cesarean Section and Bilateral Tubal ligation by Modified Pomeroy method   Pre-operative Diagnosis: Twins, Previous Cesarean Section Monichorionic diamniotic  [phone].   Post-operative Diagnosis: Same   Surgeon: Robley Fries, MD   Assistants: Alphonsus Sias. Ernestina Penna, MD  Anesthesia: spinal   Procedure Details:  The patient was seen in the Holding Room. The risks, benefits, complications, treatment options, and expected outcomes were discussed with the patient. The patient concurred with the proposed plan, giving informed consent. identified as Transport planner and the procedure verified as C-Section Delivery. A Time Out was held and the above information confirmed.  After induction of anesthesia, the patient was draped and prepped in the usual sterile manner. A Pfannenstiel Incision was made through the previous scar and carried down through the subcutaneous tissue to the fascia that was essentially replaced by scar tissue. Fascial incision was made and extended transversely. The fascia was separated from the underlying rectus tissue superiorly and inferiorly. The peritoneum was identified after separating muscles in the midline by sharp dissection and entered. Peritoneal incision was extended longitudinally mostly upwards due to bladder adhesions being high on the lower segment and peritoneum. The utero-vesical peritoneal reflection was incised transversely and the bladder flap was bluntly freed from the lower uterine segment. A low transverse uterine incision was made.  Twin A amniotomy revealed clear fluid. Baby was in frank breech position and delivered by complete breech extraction. Cord clamped and cut and baby handed to NICU team. Twin B amniotomy also noted clear fluid, was in transverse lie but  favored head down, so delivered cephalic, cord clamped and cut, baby handed to NICU team. Bleeding noted as twin B was being delivered, consistent with possible slight abruption. Cord ph was not sent. Cord blood was obtained from both for evaluation. The placenta was removed Intact and appeared normal.  The uterine outline, tubes and ovaries appeared normal. The uterine incision was closed with running locked sutures of 0Vicryl followed by imbricating second layer with 0Vicryl. Hemostasis was observed.  Bilateral Tubal ligation performed by Modified Pomeroy's method, using Plain Gut ties, creating loops of mid portion of both the fallopian tubes and excising them. Hemostasis was excellent.  Homero Fellers hematuria noted in foley bag but was clearing in foley tubing. Bladder edges noted to be clear and no injury identified. Peritoneum closed with 2-0Vicryl. Muscles approximated in the midline. The fascia was then reapproximated with running sutures of 0Vicryl. The subcuticular closure was performed using 2-0plain gut. The skin was closed with 4-0Vicryl. Steristrips and sterile dressing applied.  Instrument, sponge, and needle counts were correct prior the abdominal closure and were correct at the conclusion of the case.   Findings: Mono-Di twins; twin A girl, frank breech, delivered by complete breech extraction at 13.49 hrs, with Apgars 8 and 9 at 1 and 5 minutes, weight 5 lb 11 oz. Twin B, girl, transverse lie, delivered cephalic at 13.50 hrs wth Apgars 8 and 9 at 1 and 5 minutes, weight 5 lb 10 oz. Amniotic fluid clear for both. Monochorionic placenta appeared normal. Both ovaries and tubes normal. Hematuria noted, but bladder appeared normal on inspection at surgery.    Estimated Blood Loss: 800 cc   Total IV Fluids: 3400 cc LR   Urine Output: 150CC OF pink colored urine, blood tinge is clearing up   Specimens: Placenta to pathology,  cut segments of both fallopian tubes   Complications: no  complications  Disposition: PACU - hemodynamically stable.   Maternal Condition: Stable   Baby condition / location:  nursery-stable  Attending Attestation: I performed the procedure.   Signed: Surgeon(s): Robley Fries, MD Alphonsus Sias Ernestina Penna, MD

## 2012-06-08 NOTE — Anesthesia Postprocedure Evaluation (Signed)
  Anesthesia Post Note  Patient: [name]  Procedure(s) Performed: Procedure(s) (LRB): CESAREAN SECTION (N/A)  Anesthesia type: Spinal  Patient location: PACU  Post pain: Pain level controlled  Post assessment: Post-op Vital signs reviewed  Last Vitals:  Filed Vitals:   06/08/12 1515  BP: 99/62  Pulse: 75  Temp:   Resp: 22    Post vital signs: Reviewed  Level of consciousness: awake  Complications: No apparent anesthesia complications

## 2012-06-09 ENCOUNTER — Encounter (HOSPITAL_COMMUNITY): Payer: Self-pay | Admitting: Obstetrics and Gynecology

## 2012-06-09 DIAGNOSIS — Z98891 History of uterine scar from previous surgery: Secondary | ICD-10-CM

## 2012-06-09 DIAGNOSIS — Z9851 Tubal ligation status: Secondary | ICD-10-CM

## 2012-06-09 HISTORY — DX: Tubal ligation status: Z98.51

## 2012-06-09 HISTORY — DX: History of uterine scar from previous surgery: Z98.891

## 2012-06-09 LAB — COMPREHENSIVE METABOLIC PANEL
AST: 22 U/L (ref 0–37)
Albumin: 1.7 g/dL — ABNORMAL LOW (ref 3.5–5.2)
BUN: 7 mg/dL (ref 6–23)
Calcium: 8.2 mg/dL — ABNORMAL LOW (ref 8.4–10.5)
Chloride: 104 mEq/L (ref 96–112)
Creatinine, Ser: 0.52 mg/dL (ref 0.50–1.10)
Total Bilirubin: 0.3 mg/dL (ref 0.3–1.2)
Total Protein: 4.1 g/dL — ABNORMAL LOW (ref 6.0–8.3)

## 2012-06-09 LAB — CBC
HCT: 31.1 % — ABNORMAL LOW (ref 36.0–46.0)
Hemoglobin: 10.3 g/dL — ABNORMAL LOW (ref 12.0–15.0)
MCHC: 33.1 g/dL (ref 30.0–36.0)
RDW: 14.9 % (ref 11.5–15.5)
WBC: 10.8 10*3/uL — ABNORMAL HIGH (ref 4.0–10.5)

## 2012-06-09 MED ORDER — SODIUM CHLORIDE 0.9 % IV BOLUS (SEPSIS)
1000.0000 mL | Freq: Once | INTRAVENOUS | Status: AC
  Administered 2012-06-09: 1000 mL via INTRAVENOUS

## 2012-06-09 NOTE — Progress Notes (Signed)
HD 1  The patient husband and nurse report confusion and disorientation overnight. Patient states this morning that yesterday she felt "out of it" and does not remember things yesterday. Today she reports good orientation and feeling well. Patient does note minimal lower abdominal pain. No nausea or emesis. Patient ambulated twice yesterday but has not yet been up this a.m. Patient slept well overnight well babies went to the nursery.  Late yesterday p.m. nurse noted patient having disorientation and making inappropriate statements. Patient did have Nubain yesterday for itching. At that time a stat CBC and CMP were done and findings were within normal limits for postpartum day 0 with the exception of the sodium level of 127. A 1 L bolus over 2 hours was given and patient was allowed to rest.   O: Filed Vitals:   06/08/12 2356 06/09/12 0210 06/09/12 0530 06/09/12 0930  BP: 111/69 112/69 103/66 111/64  Pulse: 85 82 82 88  Temp: 98.2 F (36.8 C) 99.3 F (37.4 C) 98.3 F (36.8 C) 98.3 F (36.8 C)  TempSrc: Oral Oral Oral Oral  Resp: 18 18 18 18   Weight:      SpO2: 96% 97% 97% 97%   Gen.: Laying in bed, appears appropriate Neuro: Alert and oriented to person place time and situation Cardiovascular: Regular rate and rhythm Pulmonary: Clear to auscultation bilaterally Abdomen soft, nontender, nondistended, no right upper quandrant pain, the fundus appropriately tender at the level of the umbilicus Incision: Some old staining, no active draining, dressed Lower extremity: Nontender, no edema GU: Deferred, yellow-colored urine in the Foley bag and tube, no blood  Labs reviewed  Assessment and plan: 32 year old G2 P2003 postop day #1 status post repeat cesarean section for twin pregnancy. - Postoperative. Appropriate recovery with stable hemoglobin and abdominal exam. Blood tinging urine has cleared and patient is now appropriate for removal of Foley catheter. - Confusion. Unclear etiology,  possibly related to Nubain or low sodium levels. Patient seems to be improved this a.m. Will continue close watch.  Dhruti Ghuman A. 06/09/2012 12:13 PM

## 2012-06-09 NOTE — Lactation Note (Signed)
This note was copied from the chart of Kelly Derika Nez. Lactation Consultation Note  Patient Name: [name] Today's Date: 06/09/2012 Reason for consult: Follow-up assessment   Maternal Data    Feeding Feeding Type: Breast Milk Feeding method: Breast  LATCH Score/Interventions Latch: Repeated attempts needed to sustain latch, nipple held in mouth throughout feeding, stimulation needed to elicit sucking reflex.  Audible Swallowing: None  Type of Nipple: Everted at rest and after stimulation  Comfort (Breast/Nipple): Soft / non-tender     Hold (Positioning): Assistance needed to correctly position infant at breast and maintain latch.  LATCH Score: 6  Lactation Tools Discussed/Used Initiated by:: DW Date initiated:: 06/09/12   Consult Status Consult Status: Follow-up Date: 06/10/12 Follow-up type: In-patient  Assisted mom with latch. Baby would only take a few sucks. DEBP set up and mom pumping when I left room. Reviewed basic teaching. No questions at present. To call prn.  [name] 06/09/2012, 12:42 PM    

## 2012-06-09 NOTE — Progress Notes (Signed)
Patient still has periods of disorientation/Altered mental status. For example When changing peri pad patient ask "Is it money done their". When I asked if she wanted to turn on her back to get a little more comfortable patient says "I'd rather stay right here because my baby sitter isn't home and I need someone to watch my son". Vital signs WNL. Output starting to increase and is more amber than Blood tinged. Notified Dr. Ernestina Penna to update status. New orders initiated.

## 2012-06-09 NOTE — Progress Notes (Signed)
[facility] follow-up w/ pts RN - pt now receiving NS fluid bolus. AM labs to be drawn.

## 2012-06-09 NOTE — Progress Notes (Signed)
Per House Coverage, States she notified Dr.Cassidy with Anesthesia about patients condition during 0200 safety rounds and stated it was very unlikely due to anesthesia.

## 2012-06-09 NOTE — Lactation Note (Signed)
This note was copied from the chart of Girl Anayla Rainford. Lactation Consultation Note  Patient Name: Girl Nya Recker Today's Date: 06/09/2012 Reason for consult: Follow-up assessment   Maternal Data Formula Feeding for Exclusion: Yes Reason for exclusion: Mother's choice to formula and breast feed on admission  Feeding Feeding Type: Breast Milk Feeding method: Breast Nipple Type: Slow - flow Length of feed: 3 min  LATCH Score/Interventions Latch: Repeated attempts needed to sustain latch, nipple held in mouth throughout feeding, stimulation needed to elicit sucking reflex.  Audible Swallowing: None  Type of Nipple: Everted at rest and after stimulation  Comfort (Breast/Nipple): Soft / non-tender     Hold (Positioning): Assistance needed to correctly position infant at breast and maintain latch. Intervention(s): Breastfeeding basics reviewed;Support Pillows  LATCH Score: 6  Lactation Tools Discussed/Used Pump Review: Setup, frequency, and cleaning Initiated by:: DW Date initiated:: 06/09/12   Consult Status Consult Status: Follow-up Date: 06/10/12 Follow-up type: In-patient  Attempted to latch baby- fussy and would only take a few sucks. Mom wants to formula feed baby. Reviewed positioning - signs of a good latch and how to position baby at the breast. DEBP set up for mom and she is pumping when I left room. No questions at present.  Ayane Delancey D 06/09/2012, 12:38 PM    

## 2012-06-09 NOTE — Progress Notes (Addendum)
Pt's RN called [facility] phone to discuss pt's intermittent confusion (see progress note) although, she is not confused at this time. Reviewed pt history, meds rec'd since CSection on 4/4 - Toradol & Nubain), & lab results: Na+=127 & PLT 123. RN to call MD back with lab results & pt status.

## 2012-06-10 NOTE — Progress Notes (Signed)
Patient ID: [name], female   DOB: [birth year], 32 y.o.   MRN: [id number] POD # 2 S/P Repeat C/S and BTL (Twins)  Subjective: Pt reports feeling "much better".  C/O persistent itching and irritation to abd, especially at site of tegaderm.  Pt notes allergy to Latex and "sensitive" skin. Pain controlled with ibuprofen and percocet No further episodes of confusion Tolerating po/Voiding without problems/ No n/v/Flatus pos Activity: out of bed and ambulate Bleeding is light Newborn info:  Information for the patient's newborn:  Nadiah, Corbit Girl Tynia [[id number]]  female Information for the patient's newborn:  Jaimarie, Rapozo [[id number]]  female Feeding: breast   Objective: VS: Blood pressure 120/81, pulse 65, temperature 97.4 F (36.3 C), temperature source Oral, resp. rate 20.    Physical Exam:  General: alert, cooperative and no distress CV: Regular rate and rhythm Resp: clear Abdomen: soft, nontender, normal bowel sounds Uterine Fundus: firm, below umbilicus, appropriately tender Incision: Covered with tegaderm and honeycomb dsg, which is soiled with old dry bloody drainage.  Dressing removed.  Incision is well approximated and closed with subcu closure.  Steri strips soiled with old bloody dry drainage.  Steri strips replaced and benzoin applied.  Some erythema noted under dressing. Lochia: minimal Ext: edema trace to +1 and Homans sign is negative, no sign of DVT    A/P: POD # 2/ G2P2 0 0 3/ S/P Repeat C/Section w/ BTL (Twins @ 37 wks) Doing well Will plan not to replace tegaderm d/t redness, itching, and irritation Continue routine post op orders Anticipate discharge home in the am   Signed: Demetrius Revel, MSN, [facility] 06/10/2012, 10:31 AM

## 2012-06-11 ENCOUNTER — Encounter (HOSPITAL_COMMUNITY): Payer: Self-pay | Admitting: Obstetrics & Gynecology

## 2012-06-11 MED ORDER — IBUPROFEN 600 MG PO TABS: 600.0000 mg | ORAL_TABLET | Freq: Four times a day (QID) | ORAL | Status: DC | PRN

## 2012-06-11 MED ORDER — DOCUSATE SODIUM 100 MG PO CAPS: 100.0000 mg | ORAL_CAPSULE | Freq: Two times a day (BID) | ORAL | Status: DC | PRN

## 2012-06-11 MED ORDER — OXYCODONE-ACETAMINOPHEN 5-325 MG PO TABS: 1.0000 | ORAL_TABLET | Freq: Four times a day (QID) | ORAL | Status: DC | PRN

## 2012-06-11 NOTE — Progress Notes (Signed)
Patient ID: [name], female   DOB: Jan 27, 1981, 32 y.o.   MRN: [id number] POD # 3  Subjective: Pt reports feeling well and eager for d/c home/ Pain controlled with ibuprofen and percocet Tolerating po/Voiding without problems/ No n/v/Flatus pos Activity: out of bed and ambulate Bleeding is light Newborn info:  Information for the patient's newborn:  Kelly, Dillavou Girl Fernandez [[id number]]  female Information for the patient's newborn:  Kelly, Fernandez [[id number]]  female Feeding: breast   Objective: VS: Blood pressure 117/79, pulse 80, temperature 97.9 F (36.6 C), temperature source Oral, resp. rate 18.   LABS:  Recent Labs  06/08/12 2315 06/09/12 0629  WBC 10.6* 10.8*  HGB 11.1* 10.3*  HCT 33.1* 31.1*  PLT 123* 131*     Physical Exam:  General: alert, cooperative and no distress CV: Regular rate and rhythm Resp: clear Abdomen: soft, nontender, normal bowel sounds Incision: closed with subcu closure and well approximated.  Small amt thin, serous fluid noted, dried to steri strips.  No erythema, no edema, no bleeding, no dep edema Uterine Fundus: firm, below umbilicus, nontender Lochia: minimal Ext: edema trace and Homans sign is negative, no sign of DVT    A/P: POD # 3/ G2 P2003/ S/P C/Section d/t elective repeat, twins, BTL Doing well and stable for discharge home RX's: Ibuprofen 600mg  po Q 6 hrs prn pain #30 Refill x 1 Percocet 5/325 1 - 2 tabs po every 6 hrs prn pain  #30 No refill Colace 100mg   BID prn #30 Ref x 1    Signed: Demetrius Revel, MSN, [facility] 06/11/2012, 9:51 AM

## 2012-06-11 NOTE — Discharge Summary (Signed)
Obstetric Discharge Summary Reason for Admission: G2 P1 0 0 1, Twin gestation @37wks .  Desires elective BTL Prenatal Procedures: NST and ultrasound Intrapartum Procedures: cesarean: low cervical, transverse Postpartum Procedures: none Complications-Operative and Postpartum: none Hemoglobin  Date Value Range Status  06/09/2012 10.3* 12.0 - 15.0 g/dL Final     HCT  Date Value Range Status  06/09/2012 31.1* 36.0 - 46.0 % Final    Physical Exam:  General: alert, cooperative and no distress Lochia: appropriate Uterine Fundus: firm Incision: well approximated, scant amt serous drainage DVT Evaluation: No evidence of DVT seen on physical exam.  Discharge Diagnoses:  S/P Repeat C/S, Twins, and elective BTL  Discharge Information: Date: 06/11/2012 Activity: pelvic rest Diet: routine Medications: PNV, Ibuprofen, Colace and Percocet Condition: stable Instructions: refer to practice specific booklet Discharge to: home Follow-up Information   Follow up with [name],[name], [name] In 6 weeks.   Contact information:   [name] [place] [address] [phone]       Newborn Data:   [name], [name] [[id number]]  Live born female on 06/08/12 Birth Weight: 5 lb 11.7 oz (2600 g) APGAR: 8, 8   Kelly, Fernandez [[id number]]  Live born female on 06/08/12 Birth Weight: 5 lb 10.5 oz (2565 g) APGAR: 8, 9  Home with mother.  [name] 06/11/2012, 10:00 AM

## 2012-06-12 NOTE — Discharge Summary (Signed)
Reviewed and agree with note and plan. Pt d/w Anesthesiologist, confusion on POD#0 likely from Scopolamine patch, in combination with Nubain and Hyponatremia, but main drug was Scope-patch. Will notify pt  V.Juliene Pina, MD

## 2013-03-25 ENCOUNTER — Encounter: Payer: Self-pay | Admitting: Internal Medicine

## 2013-03-27 ENCOUNTER — Ambulatory Visit (INDEPENDENT_AMBULATORY_CARE_PROVIDER_SITE_OTHER): Payer: Managed Care, Other (non HMO) | Admitting: Internal Medicine

## 2013-03-27 ENCOUNTER — Encounter: Payer: Self-pay | Admitting: Internal Medicine

## 2013-03-27 VITALS — BP 118/70 | HR 73 | Temp 97.8°F | Wt 153.0 lb

## 2013-03-27 DIAGNOSIS — Z0000 Encounter for general adult medical examination without abnormal findings: Secondary | ICD-10-CM

## 2013-03-27 DIAGNOSIS — H938X9 Other specified disorders of ear, unspecified ear: Secondary | ICD-10-CM

## 2013-03-27 DIAGNOSIS — Z23 Encounter for immunization: Secondary | ICD-10-CM

## 2013-03-27 NOTE — Progress Notes (Signed)
Patient ID: [name], female   DOB: 07/28/1980, 33 y.o.   MRN: [id number]    Chief Complaint  Patient presents with  . Establish Care    New patient establish care    Allergies  Allergen Reactions  . Nubain [Nalbuphine]   . Scopolamine   . Latex Itching and Rash   HPI [age] y/o female pt is here to establish care. She is healthy, has 3 kids and denies any complaints this visit.  Will need influenza vaccine Pap smear one year back was normal. Has her gyn Had routine blood work few months back at her prior pcp office, record pending  Review of Systems  Constitutional: Negative for fever, chills, weight loss, malaise/fatigue and diaphoresis.  HENT: Negative for congestion, hearing loss and sore throat.  has itching in her right ear. Denies ringing in her ears Eyes: Negative for blurred vision, double vision and discharge. uptodate with eye exam. Wears corrective lenses Respiratory: Negative for cough, sputum production, shortness of breath and wheezing.   Cardiovascular: Negative for chest pain, palpitations, orthopnea and leg swelling.  Gastrointestinal: Negative for heartburn, nausea, vomiting, abdominal pain, diarrhea and constipation.  Genitourinary: Negative for dysuria, urgency, frequency and flank pain. LMP 03/27/12. Has had tubal ligation Musculoskeletal: Negative for back pain, falls, joint pain and myalgias.  Skin: Negative for itching and rash.  Neurological: Negative for dizziness, tingling, focal weakness and headaches.  Psychiatric/Behavioral: Negative for depression and memory loss. The patient is not nervous/anxious.  negative for insomnia   Immunization History  Administered Date(s) Administered  . Influenza Split 12/12/2011  . Tdap 04/23/2012   Past Medical History  Diagnosis Date  . Medical history non-contributory   . S/P repeat low transverse C-section 06/09/2012  . S/P tubal ligation 06/09/2012  . Postpartum care following cesarean delivery 06/09/2012    Past Surgical History  Procedure Laterality Date  . Cesarean section  2006  . Tympanoplasty  2010  . Cesarean section N/A 06/08/2012    Procedure: CESAREAN SECTION;  Surgeon: Robley FriesVaishali R [name], MD;  Location: [facility];  Service: Obstetrics;  Laterality: N/A;  Repeat C/S  EDD: 06/24/12;Twins Monochorionic diamniotic   No current outpatient prescriptions on file prior to visit.   No current facility-administered medications on file prior to visit.    Physical exam BP 118/70  Pulse 73  Temp(Src) 97.8 F (36.6 C) (Oral)  Wt 153 lb (69.4 kg)  SpO2 99%  General- adult female in no acute distress Head- atraumatic, normocephalic Eyes- PERRLA, EOMI, no pallor, no icterus, no discharge Neck- no lymphadenopathy, no thyromegaly, no jugular vein distension, no carotid bruit Ears- left ear normal tympanic membrane and normal external ear canal , right ear normal tympanic membrane and normal external ear canal Chest- no chest wall deformities, no chest wall tenderness Breast- refused Cardiovascular- normal s1,s2, no murmurs/ rubs/ gallops Respiratory- bilateral clear to auscultation, no wheeze, no rhonchi, no crackles Abdomen- bowel sounds present, soft, non tender, no organomegaly, no abdominal bruits, no guarding or rigidity, no CVA tenderness Pelvic exam- done by Gyn Musculoskeletal- able to move all 4 extremities, no spinal and paraspinal tenderness, steady gait Neurological- no focal deficit, normal reflexes, normal muscle strength, normal sensation to fine touch and vibration Psychiatry- alert and oriented to person, place and time, normal mood and affect  Labs- none available  Assessment/plan  1. Routine general medical examination at a health care facility Normal overall exam counselling on exercise and healthy eating habits provided Provided influenza vaccine uptodate with her  gyn exam To get records from prior pcp office - CMP; Future - Lipid Panel; Future - CBC with  Differential; Future  2. Ear stuffiness Normal ear canal, normal tympanic membrane. Possible eustachian tube dysfunction. No pain. To try OTC antihistamines/ decongestant like sudafed for now and reassess if no improvement

## 2013-10-02 ENCOUNTER — Ambulatory Visit (INDEPENDENT_AMBULATORY_CARE_PROVIDER_SITE_OTHER): Payer: Managed Care, Other (non HMO) | Admitting: Internal Medicine

## 2013-10-02 ENCOUNTER — Encounter: Payer: Self-pay | Admitting: Internal Medicine

## 2013-10-02 VITALS — BP 126/80 | HR 81 | Temp 97.6°F | Wt 155.0 lb

## 2013-10-02 DIAGNOSIS — H6501 Acute serous otitis media, right ear: Secondary | ICD-10-CM

## 2013-10-02 DIAGNOSIS — H6500 Acute serous otitis media, unspecified ear: Secondary | ICD-10-CM | POA: Insufficient documentation

## 2013-10-02 MED ORDER — AMOXICILLIN-POT CLAVULANATE 875-125 MG PO TABS: 1.0000 | ORAL_TABLET | Freq: Two times a day (BID) | ORAL | Status: DC

## 2013-10-02 NOTE — Progress Notes (Signed)
Patient ID: Kelly MajorShivani Brauner, female   DOB: 10/06/1980, 33 y.o.   MRN: [id number]    Chief Complaint  Patient presents with  . Otitis Media    right ear pain, fullness and pain x 2-3 weeks   Allergies  Allergen Reactions  . Nubain [Nalbuphine]   . Scopolamine   . Latex Itching and Rash   HPI 33 y/o female pt here for acute visit. She has been having pain in her right ear on and off for few weeks with sense of fullness in it. Yesterday it was extremely painful with pain going to her jaw area, she took ibuprofen for this and then pain resolved but then started noticing clear fluid discharge from her ear. She has used qtips to clean her ears at times but does not recall having pain post usage or witnessing any trauma. The discharge from the ear has been significant. She has ongoing sense of fullness with fluid flow and feels that her hearing in right ear has decreased. Denies any swimming recently [place] the [place] Denies runny nose or sore throat or cough or sinus congestion No fever or chills No difficulty swallowing  ROS Prone to frequent ear infection in childhood and has undergone surgical repair in childhood (? Recurrent middle ear infection requiring repair of auditory bones)  Past Medical History  Diagnosis Date  . Medical history non-contributory   . S/P repeat low transverse C-section 06/09/2012  . S/P tubal ligation 06/09/2012  . Postpartum care following cesarean delivery 06/09/2012   No current outpatient prescriptions on file prior to visit.   No current facility-administered medications on file prior to visit.   Physical exam BP 126/80  Pulse 81  Temp(Src) 97.6 F (36.4 C) (Oral)  Wt 155 lb (70.308 kg)  SpO2 98%  General- adult female in no acute distress Head- atraumatic, normocephalic Eyes- PERRLA, EOMI, no pallor, no icterus, no discharge Ears- left ear normal tympanic membrane and normal external ear canal , right ear normal external ear  canal, has bulging dull tympanic membrane with perforation and mild erythema on the side, purulent drainage present, mild mastoid process tenderness on exam on right side Neck- no lymphadenopathy Nose- normal nasal mucosa, no maxillary sinus tenderness, no frontal sinus tenderness Mouth- normal mucus membrane Cardiovascular- normal s1,s2, no murmurs Respiratory- bilateral clear to auscultation, no wheeze, no rhonchi, no crackles Abdomen- bowel sounds present, soft, non tender, no organomegaly, no abdominal bruits, no guarding or  Skin- warm and dry Neurologic- no focal deficit Psychiatry- alert and oriented to person, place and time, normal mood and affect  Assessment/plan  1. Right acute serous otitis media, recurrence not specified Has middle ear infection with likely rupture tympanic membrane on otoscopic exam. Her otalgia has resolved and drainage is present which also clinically goes with the TM rupture. Will have her started on augmentin 875 mg bid for now for 10 days given the likely perforation. Advised to cover her ear to prevent water entry during shower. Will avoid ear drops at present until further evaluated by ENT. ENT referral provided. Can take OTC ibuprofen prn for pain. Will see ENT 10/03/13 - Ambulatory referral to ENT

## 2013-11-06 ENCOUNTER — Ambulatory Visit (INDEPENDENT_AMBULATORY_CARE_PROVIDER_SITE_OTHER): Payer: Managed Care, Other (non HMO) | Admitting: Internal Medicine

## 2013-11-06 VITALS — BP 110/80 | HR 78 | Temp 97.9°F | Resp 18 | Wt 155.6 lb

## 2013-11-06 DIAGNOSIS — Z566 Other physical and mental strain related to work: Secondary | ICD-10-CM | POA: Insufficient documentation

## 2013-11-06 DIAGNOSIS — R443 Hallucinations, unspecified: Secondary | ICD-10-CM

## 2013-11-06 DIAGNOSIS — Z569 Unspecified problems related to employment: Secondary | ICD-10-CM

## 2013-11-06 DIAGNOSIS — R440 Auditory hallucinations: Secondary | ICD-10-CM | POA: Insufficient documentation

## 2013-11-06 DIAGNOSIS — L270 Generalized skin eruption due to drugs and medicaments taken internally: Secondary | ICD-10-CM | POA: Insufficient documentation

## 2013-11-06 MED ORDER — HYDROCORTISONE 0.5 % EX OINT: 1.0000 "application " | TOPICAL_OINTMENT | Freq: Two times a day (BID) | CUTANEOUS | Status: DC

## 2013-11-06 MED ORDER — DIPHENHYDRAMINE HCL 25 MG PO CAPS: 25.0000 mg | ORAL_CAPSULE | Freq: Four times a day (QID) | ORAL | Status: DC | PRN

## 2013-11-06 NOTE — Progress Notes (Signed)
Patient ID: [name], female   DOB: Oct 27, 1980, 33 y.o.   MRN: [id number]    Chief Complaint  Patient presents with  . Acute Visit    stress, hearing voices   Allergies  Allergen Reactions  . Nubain [Nalbuphine]   . Scopolamine   . Venlafaxine Hives    Heart palpitations, headache, burning skin, shakiness, dry mouth, urinary issues,  . Latex Itching and Rash   HPI 33 y/o female patient is here for acute visit. She has been hearing voices on and off for few months now. Initially she mention hearing voices for a month - mostly of her sister in law and husband at work. Then she mentions hearing voices few months back of her sister in law and mother in the office and that her coworker agreed to it being their voices when none of them were really present in the office. She mentions that the staff heard the voice but then again questions how the staff would recognize her parent's voice as they speak different languages. She mentions that the work environment has been stressful for her for 2 years. She feels that her manager has been talking negative about her to other people. She has heard the manager talk mean to her on her face and then gone to confront with the manager. At that point manager declines those accusations and says she would not talk bad about her. She mentions that there are people in the manager room talking bad about her and making fun of her and she hears this from her cabin but when she asks those people, they say they were not talking bad about her.  She mentions that her colleagues have heard the manager talk bad about her in front of her face and tell her to go and complain about it. She has gone to Clinical biochemist, been in counselling session with work and they have told her "you cannot change the way people think or talk and thus change yourself or look for a transfer". 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 She felt she was working with her for few months only but now feels she is stuck with her and the manager is after her job position and does not want good for the patient During all the above conversation, she randomly tends to jump from one topic or conversation to other and it takes her some time to redirect.  She has recently had otitis media and was seen by ENT. She is on bactrim ds 1 tab bid and otic antibiotic drop at present and has f/u with ENT on 11/21/13.  She had called her insurance company and was referred to neuropsychiatry who had prescribed venlafaxine for her anxiety. No notes at present to review. She took a single dose and broke out in hives 7 days back. She also had dry mouth, was not able to urinate and stopped the pill. She still has rash and burning sensation in her arms. No lesions elsewhere.  She has not heard any voices today but yesterday had voices of her sister in law telling her that she is a Chiropodist and that she should not  tell anyone. Her sister in law lives in [place]  She is a mother of 3 kids and has busy work and home schedule  ROS Denies ear aches or discharge Has experienced hearing loss Denies any visual hallucinations Feels low and has been tearful and crying about work Gaffer. Feels she will need to take a break from work Sleep cycle regular Denies suicidal ideation No history of seizure in past Has occassional headache but no headache at present  Of note Spoke with her husband after getting patient's permission. He mentions that this has been going on for atleast 7-8 months. He mentions that she has been having negative thoughts about people at work talking bad about her for a year. She has been hearing  voices and telling things that are not true. She removed her kid's helmet the other day saying it has recorders in it through which voices are being transmitted to the office. There are times when patient is found to be sitting and crying by herself  Past Medical History  Diagnosis Date  . Medical history non-contributory   . S/P repeat low transverse C-section 06/09/2012  . S/P tubal ligation 06/09/2012  . Postpartum care following cesarean delivery 06/09/2012   No current outpatient prescriptions on file prior to visit.   No current facility-administered medications on file prior to visit.   Physical exam BP 110/80  Pulse 78  Temp(Src) 97.9 F (36.6 C)  Resp 18  Wt 155 lb 9.6 oz (70.58 kg)  SpO2 98%  General- adult female in no acute distress Head- atraumatic, normocephalic Eyes- PERRLA, EOMI, no pallor, no icterus, no discharge Ears- left ear normal tympanic membrane and normal external ear canal , right ear some cerumen, normal ear canal, unable to visualise the tympanic membrane Neck- no lymphadenopathy, no thyromegaly Mouth- normal mucus membrane, no sores in mouth, normal oropharynx Cardiovascular- normal s1,s2, no murmurs Respiratory- bilateral clear to auscultation, no wheeze, no rhonchi, no crackles Abdomen- bowel sounds present, soft, non tender Musculoskeletal- able to move all 4 extremities, normal range of motion, no leg edema Neurological- normal reflexes, normal muscle strength, normal sensation to fine touch  Skin- erythema in both arms with some warmth noted, no open sore/wound Psychiatry- alert and oriented to person, place and time, has tangential speech  Assessment/plan  1. Auditory hallucination She has been having auditory hallucinations with paranoid delusion/ false belief and disorganized speech with tangential speech and derailment. These all point towards positive symptoms.i have concerns for schizophrenia given it has been present for more than 6 months.  She recently had ear infection with perforated tympanic membrane nd I would like her to be seen by ENT to assess for peripheral and central conduction pathway abnormality. Patient does not want to be started on any medication at present. Agrees to see psychiatry- referral provided and pt asked to call and schedule an appointment. Patient gives permission to talk with her husband about her health issues and ask relevant questions. Warning signs for medical emergency provided. After reviewing notes from ENT and psychiatry, consider ct head if the symptom persists or worsens. Explained to the husband of the patient to look for warning signs and need to seek emergency help if he notices suicidal ideation or patient being a threat to self or other with her hallucinations - Ambulatory referral to Psychiatry  2. Stress at work This could be contributing majorly to her current mental situation and hallucinations. Pt does not want any new ood medication at present. Willing to  have pschiatric visit and counselling. Referral provided.  - Ambulatory referral to Psychiatry - CBC with Differential  3. Drug rash D/c venlafaxine and put in allergy list. Benadryl 25 mg q8h prn for itching and rash with hydrocortisone ointment 0.5% bid for a week. Reassess if no improvement. Avoiding po prednisone with her recent ongoing hallucinations - CBC with Differential - CMP  More than 50% of this visit time was spent in counselling

## 2013-11-07 LAB — COMPREHENSIVE METABOLIC PANEL
ALT: 49 IU/L — AB (ref 0–32)
AST: 30 IU/L (ref 0–40)
Albumin/Globulin Ratio: 1.7 (ref 1.1–2.5)
Albumin: 4.2 g/dL (ref 3.5–5.5)
Alkaline Phosphatase: 88 IU/L (ref 39–117)
BUN/Creatinine Ratio: 16 (ref 8–20)
BUN: 11 mg/dL (ref 6–20)
CALCIUM: 9.4 mg/dL (ref 8.7–10.2)
CHLORIDE: 102 mmol/L (ref 97–108)
CO2: 25 mmol/L (ref 18–29)
Creatinine, Ser: 0.68 mg/dL (ref 0.57–1.00)
GFR calc Af Amer: 132 mL/min/{1.73_m2} (ref >59)
GFR calc non Af Amer: 114 mL/min/{1.73_m2} (ref >59)
Globulin, Total: 2.5 g/dL (ref 1.5–4.5)
Glucose: 97 mg/dL (ref 65–99)
POTASSIUM: 4.9 mmol/L (ref 3.5–5.2)
Sodium: 142 mmol/L (ref 134–144)
Total Bilirubin: <0.2 mg/dL (ref 0.0–1.2)
Total Protein: 6.7 g/dL (ref 6.0–8.5)

## 2013-11-07 LAB — CBC WITH DIFFERENTIAL/PLATELET
BASOS ABS: 0 10*3/uL (ref 0.0–0.2)
BASOS: 1 %
EOS ABS: 0.1 10*3/uL (ref 0.0–0.4)
Eos: 4 %
HCT: 36.3 % (ref 34.0–46.6)
Hemoglobin: 11.9 g/dL (ref 11.1–15.9)
IMMATURE GRANS (ABS): 0 10*3/uL (ref 0.0–0.1)
Immature Granulocytes: 0 %
Lymphocytes Absolute: 1.5 10*3/uL (ref 0.7–3.1)
Lymphs: 41 %
MCH: 27 pg (ref 26.6–33.0)
MCHC: 32.8 g/dL (ref 31.5–35.7)
MCV: 82 fL (ref 79–97)
Monocytes Absolute: 0.3 10*3/uL (ref 0.1–0.9)
Monocytes: 7 %
NEUTROS PCT: 47 %
Neutrophils Absolute: 1.7 10*3/uL (ref 1.4–7.0)
RBC: 4.41 x10E6/uL (ref 3.77–5.28)
RDW: 14.7 % (ref 12.3–15.4)
WBC: 3.6 10*3/uL (ref 3.4–10.8)

## 2013-11-08 ENCOUNTER — Encounter: Payer: Self-pay | Admitting: *Deleted

## 2013-11-14 ENCOUNTER — Encounter: Payer: Self-pay | Admitting: *Deleted

## 2013-11-14 ENCOUNTER — Telehealth: Payer: Self-pay | Admitting: *Deleted

## 2013-11-14 NOTE — Telephone Encounter (Signed)
Spoke with patient in the office regarding faxed note to neuropsychiatric care center, told her that it has been faxed 2 times. I then faxed it a third time to the office per Dr. Glade Lloyd.

## 2014-01-06 ENCOUNTER — Encounter: Payer: Self-pay | Admitting: Internal Medicine

## 2014-03-28 ENCOUNTER — Other Ambulatory Visit: Payer: Managed Care, Other (non HMO)

## 2014-04-01 ENCOUNTER — Encounter: Payer: Managed Care, Other (non HMO) | Admitting: Internal Medicine

## 2014-04-23 ENCOUNTER — Encounter: Payer: Self-pay | Admitting: Internal Medicine

## 2014-04-23 ENCOUNTER — Ambulatory Visit (INDEPENDENT_AMBULATORY_CARE_PROVIDER_SITE_OTHER): Payer: BLUE CROSS/BLUE SHIELD | Admitting: Internal Medicine

## 2014-04-23 VITALS — BP 122/64 | HR 68 | Temp 97.6°F | Resp 18 | Ht 65.0 in | Wt 153.6 lb

## 2014-04-23 DIAGNOSIS — F411 Generalized anxiety disorder: Secondary | ICD-10-CM

## 2014-04-23 DIAGNOSIS — H938X1 Other specified disorders of right ear: Secondary | ICD-10-CM

## 2014-04-23 DIAGNOSIS — G44209 Tension-type headache, unspecified, not intractable: Secondary | ICD-10-CM

## 2014-04-23 MED ORDER — CITALOPRAM HYDROBROMIDE 10 MG PO TABS: 10.0000 mg | ORAL_TABLET | Freq: Every day | ORAL | Status: DC

## 2014-04-23 NOTE — Progress Notes (Signed)
Patient ID: [name], female   DOB: [birth year], 34 y.o.   MRN: [id number]    Facility  [facility]    Place of Service:   OFFICE   Allergies  Allergen Reactions  . Nubain [Nalbuphine]   . Scopolamine   . Venlafaxine Hives    Heart palpitations, headache, burning skin, shakiness, dry mouth, urinary issues,  . Latex Itching and Rash    Chief Complaint  Patient presents with  . Acute Visit    headaches, still feels liquid ear wax, on left side of head over ear.    HPI:  34 yo female seen today for above. She reports intermittent left temporal HA, pounding x 10 days. No visual or hearing changes. She has at least 2 HA per day and feels like a tight band on head. No fever but has occasional chills. No neck pain or stiffness. No sinus pressure or pain. She would like a MRI to further eval. Last MRI in 2013 due to trigeminal neuralgia. No rash. No numbness or tingling but scalp feels tender. Still has auditory hallucinations.  Increased work stressors. She also has 30 mo old twin girls and a 18 yo son. Spouse present today Medications: Patient's Medications  New Prescriptions   No medications on file  Previous Medications   HYDROCORTISONE OINTMENT 0.5 %    Apply 1 application topically 2 (two) times daily.   NEOMYCIN-POLYMYXIN-HYDROCORTISONE (CORTISPORIN) OTIC SOLUTION    Place 5 drops into the right ear 2 (two) times daily.    SULFAMETHOXAZOLE-TRIMETHOPRIM (BACTRIM DS) 800-160 MG PER TABLET       VENLAFAXINE (EFFEXOR) 37.5 MG TABLET    Take 37.5 mg by mouth 2 (two) times daily.  Modified Medications   No medications on file  Discontinued Medications   DIPHENHYDRAMINE (BENADRYL) 25 MG CAPSULE    Take 1 capsule (25 mg total) by mouth every 6 (six) hours as needed.     Review of Systems  Constitutional: Positive for chills and fatigue. Negative for appetite change.  HENT: Positive for ear discharge.   Respiratory: Negative for chest tightness and shortness of breath.     Cardiovascular: Positive for chest pain.  Gastrointestinal: Positive for abdominal pain.  Neurological: Positive for dizziness. Negative for weakness.  Psychiatric/Behavioral: Negative for suicidal ideas and sleep disturbance. The patient is nervous/anxious.    Filed Vitals:   04/23/14 0948  BP: 122/64  Pulse: 68  Temp: 97.6 F (36.4 C)  TempSrc: Oral  Resp: 18  Height:  (1.651 m)  Weight: 153 lb 9.6 oz (69.673 kg)  SpO2: 97%   Body mass index is 25.56 kg/(m^2).  Physical Exam CONSTITUTIONAL: Looks nervous in NAD. Awake, alert and oriented x 3 HEENT: right TM dull, bulging with air fluid level. TM patch intact. No redness. Left TM appears nml.  PERRLA. No sinus TTP. Oropharynx clear and without exudate. MMM NECK: Supple. Nontender. No palpable cervical or supraclavicular lymph nodes. No carotid bruit b/l.   CVS: Regular rate without murmur, gallop or rub. LUNGS: CTA b/l no wheezing, rales or rhonchi. EXTREMITIES: No edema b/l. Distal pulses palpable. No calf tenderness PSYCH: anxious SKIN: left temporal scalp TTP but no rash noted. No temporal artery TTP Labs reviewed: No visits with results within 3 Month(s) from this visit. Latest known visit with results is:  Office Visit on 11/06/2013  Component Date Value Ref Range Status  . WBC 11/06/2013 3.6  3.4 - 10.8 x10E3/uL Final  . RBC 11/06/2013 4.41  3.77 - 5.28  x10E6/uL Final  . Hemoglobin 11/06/2013 11.9  11.1 - 15.9 g/dL Final  . HCT 16/10/960409/04/2013 36.3  34.0 - 46.6 % Final  . MCV 11/06/2013 82  79 - 97 fL Final  . MCH 11/06/2013 27.0  26.6 - 33.0 pg Final  . MCHC 11/06/2013 32.8  31.5 - 35.7 g/dL Final  . RDW 54/09/811909/04/2013 14.7  12.3 - 15.4 % Final  . Neutrophils Relative % 11/06/2013 47   Final  . Lymphs 11/06/2013 41   Final  . Monocytes 11/06/2013 7   Final  . Eos 11/06/2013 4   Final  . Basos 11/06/2013 1   Final  . Neutrophils Absolute 11/06/2013 1.7  1.4 - 7.0 x10E3/uL Final  . Lymphocytes Absolute 11/06/2013  1.5  0.7 - 3.1 x10E3/uL Final  . Monocytes Absolute 11/06/2013 0.3  0.1 - 0.9 x10E3/uL Final  . Eosinophils Absolute 11/06/2013 0.1  0.0 - 0.4 x10E3/uL Final  . Basophils Absolute 11/06/2013 0.0  0.0 - 0.2 x10E3/uL Final  . Immature Granulocytes 11/06/2013 0   Final  . Immature Grans (Abs) 11/06/2013 0.0  0.0 - 0.1 x10E3/uL Final  . Glucose 11/06/2013 97  65 - 99 mg/dL Final  . BUN 14/78/295609/04/2013 11  6 - 20 mg/dL Final  . Creatinine, Ser 11/06/2013 0.68  0.57 - 1.00 mg/dL Final  . GFR calc non Af Amer 11/06/2013 114  >59 mL/min/1.73 Final  . GFR calc Af Amer 11/06/2013 132  >59 mL/min/1.73 Final  . BUN/Creatinine Ratio 11/06/2013 16  8 - 20 Final  . Sodium 11/06/2013 142  134 - 144 mmol/L Final  . Potassium 11/06/2013 4.9  3.5 - 5.2 mmol/L Final  . Chloride 11/06/2013 102  97 - 108 mmol/L Final  . CO2 11/06/2013 25  18 - 29 mmol/L Final  . Calcium 11/06/2013 9.4  8.7 - 10.2 mg/dL Final  . Total Protein 11/06/2013 6.7  6.0 - 8.5 g/dL Final  . Albumin 21/30/865709/04/2013 4.2  3.5 - 5.5 g/dL Final  . Globulin, Total 11/06/2013 2.5  1.5 - 4.5 g/dL Final  . Albumin/Globulin Ratio 11/06/2013 1.7  1.1 - 2.5 Final  . Total Bilirubin 11/06/2013 <0.2  0.0 - 1.2 mg/dL Final  . Alkaline Phosphatase 11/06/2013 88  39 - 117 IU/L Final  . AST 11/06/2013 30  0 - 40 IU/L Final  . ALT 11/06/2013 49* 0 - 32 IU/L Final    *RADIOLOGY REPORT*  Clinical Data: 1 month persistent left temporal headache with left mandibular pain/numbness. Bell's palsy.  CT HEAD WITHOUT AND WITH CONTRAST  Technique: Contiguous axial images were obtained from the base of the skull through the vertex without and with intravenous contrast.  Contrast: Pre and post intravenous 75 ml Omnipaque-300.  Comparison: None.  Findings: Cerebrum, cerebral ventricles, brainstem, and cerebellum appear normal. (Specifically no acute intracerebral edema, hemorrhage, tumor, mass effect/midline shift, acute  extra-axial hemorrhage/collection).  Vasculature unremarkable.  Calvarium appears normal.  IMPRESSION: Normal.  Original Report Authenticated By: Barnie DelMINTA E. PHILLIPS, M.D   Chart reviewed   Assessment/Plan   ICD-9-CM ICD-10-CM   1. Generalized anxiety disorder 300.02 F41.1 citalopram (CELEXA) 10 MG tablet  2. Tension-type headache, not intractable, unspecified chronicity pattern 339.10 G44.209   3. Ear stuffiness, right 388.8 H93.8X1    --treat anxiety with med. Highly recommend counseling so that she may have tools to reduce anxiety. We discussed keeping a worry diary and having a STOP mechanism.  --f/u with ENT prn d/c or pain  --will consider CT head or MRI  brain if HA persists  --f/u in 1 month to re-eval. Keep appt with Dr Glade Lloyd in May 2016   [facility] S. Ancil Linsey  [facility] [address] [place], [place] [address] [phone] Office (Wednesdays and Fridays 8 AM - 5 PM) [phone] Cell (Monday-Friday 8 AM - 5 PM)

## 2014-04-23 NOTE — Patient Instructions (Addendum)
Recommend counseling for stress  Try reducing anxiety with keeping diary and application of STOP mechanism  F/u in May with Dr Glade LloydPandey  F/u in 1 month to reck anxiety

## 2014-04-29 ENCOUNTER — Encounter: Payer: Self-pay | Admitting: Internal Medicine

## 2014-06-23 ENCOUNTER — Ambulatory Visit: Payer: BLUE CROSS/BLUE SHIELD | Admitting: Nurse Practitioner

## 2014-06-24 ENCOUNTER — Ambulatory Visit: Payer: BLUE CROSS/BLUE SHIELD | Admitting: Internal Medicine

## 2014-07-21 ENCOUNTER — Other Ambulatory Visit: Payer: Self-pay

## 2014-07-21 DIAGNOSIS — Z0000 Encounter for general adult medical examination without abnormal findings: Secondary | ICD-10-CM

## 2014-07-25 ENCOUNTER — Other Ambulatory Visit: Payer: BLUE CROSS/BLUE SHIELD

## 2014-07-25 DIAGNOSIS — Z0000 Encounter for general adult medical examination without abnormal findings: Secondary | ICD-10-CM

## 2014-07-26 LAB — CBC WITH DIFFERENTIAL/PLATELET
Basophils Absolute: 0 10*3/uL (ref 0.0–0.2)
Basos: 0 %
EOS (ABSOLUTE): 0.1 10*3/uL (ref 0.0–0.4)
Eos: 1 %
Hematocrit: 41.3 % (ref 34.0–46.6)
Hemoglobin: 13.2 g/dL (ref 11.1–15.9)
Immature Grans (Abs): 0 10*3/uL (ref 0.0–0.1)
Immature Granulocytes: 0 %
LYMPHS ABS: 2.4 10*3/uL (ref 0.7–3.1)
Lymphs: 32 %
MCH: 26.7 pg (ref 26.6–33.0)
MCHC: 32 g/dL (ref 31.5–35.7)
MCV: 84 fL (ref 79–97)
MONOCYTES: 5 %
Monocytes Absolute: 0.4 10*3/uL (ref 0.1–0.9)
NEUTROS PCT: 62 %
Neutrophils Absolute: 4.6 10*3/uL (ref 1.4–7.0)
Platelets: 254 10*3/uL (ref 150–379)
RBC: 4.94 x10E6/uL (ref 3.77–5.28)
RDW: 14.8 % (ref 12.3–15.4)
WBC: 7.4 10*3/uL (ref 3.4–10.8)

## 2014-07-26 LAB — COMPREHENSIVE METABOLIC PANEL
A/G RATIO: 1.9 (ref 1.1–2.5)
ALT: 10 IU/L (ref 0–32)
AST: 14 IU/L (ref 0–40)
Albumin: 4.4 g/dL (ref 3.5–5.5)
Alkaline Phosphatase: 75 IU/L (ref 39–117)
BILIRUBIN TOTAL: 0.3 mg/dL (ref 0.0–1.2)
BUN / CREAT RATIO: 21 — AB (ref 8–20)
BUN: 15 mg/dL (ref 6–20)
CO2: 26 mmol/L (ref 18–29)
Calcium: 9.5 mg/dL (ref 8.7–10.2)
Chloride: 101 mmol/L (ref 97–108)
Creatinine, Ser: 0.73 mg/dL (ref 0.57–1.00)
GFR calc Af Amer: 123 mL/min/{1.73_m2} (ref >59)
GFR calc non Af Amer: 107 mL/min/{1.73_m2} (ref >59)
GLOBULIN, TOTAL: 2.3 g/dL (ref 1.5–4.5)
GLUCOSE: 86 mg/dL (ref 65–99)
Potassium: 5 mmol/L (ref 3.5–5.2)
Sodium: 140 mmol/L (ref 134–144)
Total Protein: 6.7 g/dL (ref 6.0–8.5)

## 2014-07-26 LAB — LIPID PANEL
Chol/HDL Ratio: 4 ratio units (ref 0.0–4.4)
Cholesterol, Total: 171 mg/dL (ref 100–199)
HDL: 43 mg/dL (ref >39)
LDL Calculated: 106 mg/dL — ABNORMAL HIGH (ref 0–99)
TRIGLYCERIDES: 111 mg/dL (ref 0–149)
VLDL CHOLESTEROL CAL: 22 mg/dL (ref 5–40)

## 2014-07-29 ENCOUNTER — Encounter: Payer: Managed Care, Other (non HMO) | Admitting: Internal Medicine

## 2014-07-31 ENCOUNTER — Ambulatory Visit (INDEPENDENT_AMBULATORY_CARE_PROVIDER_SITE_OTHER): Payer: BLUE CROSS/BLUE SHIELD | Admitting: Nurse Practitioner

## 2014-07-31 ENCOUNTER — Encounter: Payer: Self-pay | Admitting: Nurse Practitioner

## 2014-07-31 VITALS — BP 100/70 | HR 71 | Temp 97.6°F | Resp 18 | Ht 65.0 in | Wt 143.2 lb

## 2014-07-31 DIAGNOSIS — Z0000 Encounter for general adult medical examination without abnormal findings: Secondary | ICD-10-CM | POA: Diagnosis not present

## 2014-07-31 NOTE — Progress Notes (Signed)
Patient ID: [name], female   DOB: 10/06/1980, 34 y.o.   MRN: [id number]    PCP: Kelly SellerEUBANKS, [name], NP  Allergies  Allergen Reactions  . Nubain [Nalbuphine]   . Scopolamine   . Venlafaxine Hives    Heart palpitations, headache, burning skin, shakiness, dry mouth, urinary issues,  . Latex Itching and Rash    Chief Complaint  Patient presents with  . Annual Exam    rt side of ear needs to be checked has hle in ear drum     HPI: Patient is a 34 y.o. female seen in the office today for annual exam. 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  Screenings: Breast Cancer- done by GYN, scheduled appt next month Cervical Cancer- done by GYN   Vaccines Up to date on: influenza and TDAP  Smoking status: none Alcohol use: occasional   Dentist: yearly Ophthalmologist: yearly  Exercise regimen: none Diet: none [facility] screen [facility] 07/31/2014 04/23/2014 03/27/2013  Decreased Interest 0 0 0  Down, Depressed, Hopeless 1 0 0  PHQ - 2 Score 1 0 0     Advanced Directive information Does patient have an advance directive?: No, Would patient like information on creating an advanced directive?: Yes - Educational materials given Review of Systems:  Review of Systems  Constitutional: Negative for activity change, appetite change, fatigue and unexpected weight change.  HENT: Negative for congestion and hearing loss.   Respiratory: Negative for cough and shortness of breath.   Cardiovascular: Negative for chest pain, palpitations and leg swelling.  Gastrointestinal: Negative for abdominal pain, diarrhea and constipation.  Genitourinary: Negative for dysuria and difficulty urinating.  Musculoskeletal: Negative for myalgias and arthralgias.  Skin: Negative for color change and wound.  Neurological: Negative for dizziness and weakness.  Psychiatric/Behavioral: Negative  for behavioral problems, confusion and agitation.    Past Medical History  Diagnosis Date  . Medical history non-contributory   . S/P repeat low transverse C-section 06/09/2012  . S/P tubal ligation 06/09/2012  . Postpartum care following cesarean delivery 06/09/2012   Past Surgical History  Procedure Laterality Date  . Cesarean section  2006  . Tympanoplasty  2010  . Cesarean section N/A 06/08/2012    Procedure: CESAREAN SECTION;  Surgeon: Robley FriesVaishali R [name], MD;  Location: [facility];  Service: Obstetrics;  Laterality: N/A;  Repeat C/S  EDD: 06/24/12;Twins Monochorionic diamniotic   Social History:   reports that she has never smoked. She does not have any smokeless tobacco history on file. She reports that she drinks alcohol. She reports that she does not use illicit drugs.  Family History  Problem Relation Age of Onset  .       Medications: Patient's Medications  New Prescriptions   No medications on file  Previous Medications   HYDROCORTISONE OINTMENT 0.5 %    Apply 1 application topically 2 (two) times daily.  Modified Medications   No medications on file  Discontinued Medications   CITALOPRAM (CELEXA) 10 MG TABLET    Take 1 tablet (10 mg total) by mouth daily.   NEOMYCIN-POLYMYXIN-HYDROCORTISONE (CORTISPORIN) OTIC SOLUTION    Place 5 drops into the right ear 2 (two) times daily.    SULFAMETHOXAZOLE-TRIMETHOPRIM (BACTRIM DS) 800-160 MG PER TABLET         Physical Exam:  Filed Vitals:   07/31/14 0903  BP: 100/70  Pulse: 71  Temp: 97.6 F (36.4 C)  TempSrc: Oral  Resp: 18  Height:  (1.651 m)  Weight: 143 lb 3.2 oz (64.955 kg)  SpO2: 98%    Physical Exam  Constitutional: She is oriented to person, place, and time. She appears well-developed and well-nourished. No distress.  HENT:  Head: Normocephalic and atraumatic.  Mouth/Throat: Oropharynx is clear and moist. No oropharyngeal exudate.  Eyes: Conjunctivae are normal. Pupils are equal, round, and reactive to light.   Neck: Normal range of motion. Neck supple.  Cardiovascular: Normal rate, regular rhythm and normal heart sounds.   Pulmonary/Chest: Effort normal and breath sounds normal.  Abdominal: Soft. Bowel sounds are normal.  Genitourinary:  Done by GYN  Musculoskeletal: She exhibits no edema or tenderness.  Neurological: She is alert and oriented to person, place, and time. She has normal reflexes.  Skin: Skin is warm and dry. She is not diaphoretic.  Psychiatric: She has a normal mood and affect.    Labs reviewed: Basic Metabolic Panel:  Recent Labs  40/98/11 1145 07/25/14 0833  NA 142 140  Fernandez 4.9 5.0  CL 102 101  CO2 25 26  GLUCOSE 97 86  BUN 11 15  CREATININE 0.68 0.73  CALCIUM 9.4 9.5   Liver Function Tests:  Recent Labs  11/06/13 1145 07/25/14 0833  AST 30 14  ALT 49* 10  ALKPHOS 88 75  BILITOT <0.2 0.3  PROT 6.7 6.7   No results for input(s): LIPASE, AMYLASE in the last 8760 hours. No results for input(s): AMMONIA in the last 8760 hours. CBC:  Recent Labs  11/06/13 1145 07/25/14 0833  WBC 3.6 7.4  NEUTROABS 1.7 4.6  HGB 11.9  --   HCT 36.3 41.3  MCV 82  --    Lipid Panel:  Recent Labs  07/25/14 0833  CHOL 171  HDL 43  LDLCALC 106*  TRIG 111  CHOLHDL 4.0   TSH: No results for input(s): TSH in the last 8760 hours. A1C: No results found for: HGBA1C   Assessment/Plan  1. Preventative health care The patient is doing well and no distinct problems were identified on exam. Encourage to follow up with ENT.  The patient was counseled regarding the appropriate use of alcohol, regular self-examination of the breasts on a monthly basis, prevention of dental and periodontal disease, heart healthy diet, regular sustained exercise for at least 30 minutes 5 times per week, will follow up in 1 year and as needed. Pt has schedule GYN appt for PAP. Labs reviewed with pt today. - Comprehensive metabolic panel; Future - Lipid panel; Future - CBC with  Differential; Future

## 2014-07-31 NOTE — Patient Instructions (Signed)

## 2014-08-01 ENCOUNTER — Encounter: Payer: BLUE CROSS/BLUE SHIELD | Admitting: Internal Medicine

## 2014-09-22 ENCOUNTER — Encounter: Payer: Self-pay | Admitting: Nurse Practitioner

## 2014-10-28 ENCOUNTER — Ambulatory Visit: Payer: BLUE CROSS/BLUE SHIELD | Admitting: Nurse Practitioner

## 2014-11-04 ENCOUNTER — Encounter: Payer: Self-pay | Admitting: Nurse Practitioner

## 2014-11-04 ENCOUNTER — Ambulatory Visit (INDEPENDENT_AMBULATORY_CARE_PROVIDER_SITE_OTHER): Payer: BLUE CROSS/BLUE SHIELD | Admitting: Nurse Practitioner

## 2014-11-04 VITALS — BP 98/52 | HR 82 | Temp 98.0°F | Resp 18 | Ht 65.0 in | Wt 143.8 lb

## 2014-11-04 DIAGNOSIS — R51 Headache: Secondary | ICD-10-CM

## 2014-11-04 DIAGNOSIS — R519 Headache, unspecified: Secondary | ICD-10-CM

## 2014-11-04 NOTE — Progress Notes (Signed)
Patient ID: [name], female   DOB: 1980/11/06, 34 y.o.   MRN: [id number]    PCP: Sharon Seller, NP  Allergies  Allergen Reactions  . Nubain [Nalbuphine] Itching  . Scopolamine Itching  . Venlafaxine Hives    Heart palpitations, headache, burning skin, shakiness, dry mouth, urinary issues,  . Latex Itching and Rash    Chief Complaint  Patient presents with  . Medical Management of Chronic Issues    Talk about getting a referral for neurologist     HPI: Patient is a 34 y.o. female seen in the office today due to neurology referral.  Due to severe pain on left side of head. Pulsing. Also with numbness and tingling. Feels like something is moving down her head Lasting 2 mins. In the last 2 months it has been exteremly painful. . Also hears things/voices from left ear that are not there. Symptoms are not everyday, rarely happens, once in 2-3 months and has been dealing with this but now things are exteremly painful. Has been going on for about a year except the bad pain is new which happened 2 weeks ago and then maybe 3 months ago.  No changes in vision during events. No weakness but has presyncopal feelings. No syncope. Denies headaches, hx of migraines. No pain to joints noted or body aches. no fevers. Has seen neurologist at [facility] would like to go back there.  When she was a baby had hx of febrile seizures, none since.   Pt has hx of hallucination, husband reports this is number 1 issue.  Pt reports the hallucinations do not bother her,they are not threatening. Husband thinks hallucinations are bothering her more than she will admit.   Advanced Directive information Does patient have an advance directive?: No, Would patient like information on creating an advanced directive?: Yes - Educational materials given Review of Systems:  Review of Systems  Constitutional: Negative for activity change, appetite change and fatigue.  HENT: Negative for congestion and ear  discharge.   Respiratory: Negative for shortness of breath.   Cardiovascular: Negative for chest pain and leg swelling.  Neurological: Positive for dizziness (after event, otherwise none), light-headedness (during event only) and numbness (during event only, but not after event). Negative for tremors, seizures, syncope, facial asymmetry, speech difficulty, weakness and headaches.  Psychiatric/Behavioral: Positive for hallucinations.    Past Medical History  Diagnosis Date  . Medical history non-contributory   . S/P repeat low transverse C-section 06/09/2012  . S/P tubal ligation 06/09/2012  . Postpartum care following cesarean delivery 06/09/2012   Past Surgical History  Procedure Laterality Date  . Cesarean section  2006  . Tympanoplasty  2010  . Cesarean section N/A 06/08/2012    Procedure: CESAREAN SECTION;  Surgeon: Robley Fries, MD;  Location: [facility];  Service: Obstetrics;  Laterality: N/A;  Repeat C/S  EDD: 06/24/12;Twins Monochorionic diamniotic   Social History:   reports that she has never smoked. She does not have any smokeless tobacco history on file. She reports that she drinks alcohol. She reports that she does not use illicit drugs.  Family History  Problem Relation Age of Onset  .       Medications: Patient's Medications  New Prescriptions   No medications on file  Previous Medications   HYDROCORTISONE OINTMENT 0.5 %    Apply 1 application topically 2 (two) times daily.  Modified Medications   No medications on file  Discontinued Medications   No medications on file  Physical Exam:  Filed Vitals:   11/04/14 1557  BP: 98/52  Pulse: 82  Temp: 98 F (36.7 C)  TempSrc: Oral  Resp: 18  Height: 5\' 5"  (1.651 m)  Weight: 143 lb 12.8 oz (65.227 kg)  SpO2: 98%    Physical Exam  Constitutional: She is oriented to person, place, and time. She appears well-developed and well-nourished. No distress.  HENT:  Head: Normocephalic and atraumatic.  Mouth/Throat:  Oropharynx is clear and moist. No oropharyngeal exudate.  Eyes: Conjunctivae are normal. Pupils are equal, round, and reactive to light.  Neck: Normal range of motion. Neck supple.  Cardiovascular: Normal rate, regular rhythm and normal heart sounds.   Pulmonary/Chest: Effort normal and breath sounds normal.  Abdominal: Soft. Bowel sounds are normal.  Neurological: She is alert and oriented to person, place, and time. She has normal strength and normal reflexes. She displays no tremor and normal reflexes. No cranial nerve deficit or sensory deficit. She exhibits normal muscle tone. Coordination normal.  Skin: Skin is warm and dry. She is not diaphoretic.  Psychiatric: She has a normal mood and affect.    Labs reviewed: Basic Metabolic Panel:  Recent Labs  19/14/78 1145 07/25/14 0833  NA 142 140  K 4.9 5.0  CL 102 101  CO2 25 26  GLUCOSE 97 86  BUN 11 15  CREATININE 0.68 0.73  CALCIUM 9.4 9.5   Liver Function Tests:  Recent Labs  11/06/13 1145 07/25/14 0833  AST 30 14  ALT 49* 10  ALKPHOS 88 75  BILITOT <0.2 0.3  PROT 6.7 6.7   No results for input(s): LIPASE, AMYLASE in the last 8760 hours. No results for input(s): AMMONIA in the last 8760 hours. CBC:  Recent Labs  11/06/13 1145 07/25/14 0833  WBC 3.6 7.4  NEUTROABS 1.7 4.6  HGB 11.9  --   HCT 36.3 41.3  MCV 82  --    Lipid Panel:  Recent Labs  07/25/14 0833  CHOL 171  HDL 43  LDLCALC 106*  TRIG 111  CHOLHDL 4.0   TSH: No results for input(s): TSH in the last 8760 hours. A1C: No results found for: HGBA1C   Assessment/Plan 1. Facial pain -new pain with numbness, no visual disturbances or weakness noted. -negative exam today, no recent symptoms -would like neurology referral for evaluation, will need further imagining but will defer this to neurology.  - Ambulatory referral to Neurology   Kaylan Friedmann K. Biagio Borg  [facility] [phone] 8 am - 5  pm) [phone] (after hours)

## 2014-12-02 ENCOUNTER — Encounter: Payer: Self-pay | Admitting: Nurse Practitioner

## 2014-12-27 ENCOUNTER — Emergency Department (HOSPITAL_BASED_OUTPATIENT_CLINIC_OR_DEPARTMENT_OTHER)
Admission: EM | Admit: 2014-12-27 | Discharge: 2014-12-27 | Disposition: A | Discharge status: Home / Self Care | Payer: BLUE CROSS/BLUE SHIELD | Attending: Emergency Medicine | Admitting: Emergency Medicine

## 2014-12-27 ENCOUNTER — Encounter (HOSPITAL_BASED_OUTPATIENT_CLINIC_OR_DEPARTMENT_OTHER): Payer: Self-pay | Admitting: Emergency Medicine

## 2014-12-27 DIAGNOSIS — R440 Auditory hallucinations: Secondary | ICD-10-CM | POA: Diagnosis not present

## 2014-12-27 DIAGNOSIS — Z91040 Latex allergy status: Secondary | ICD-10-CM | POA: Insufficient documentation

## 2014-12-27 DIAGNOSIS — R42 Dizziness and giddiness: Secondary | ICD-10-CM | POA: Insufficient documentation

## 2014-12-27 DIAGNOSIS — R51 Headache: Secondary | ICD-10-CM | POA: Diagnosis present

## 2014-12-27 DIAGNOSIS — R519 Headache, unspecified: Secondary | ICD-10-CM

## 2014-12-27 LAB — URINALYSIS, ROUTINE W REFLEX MICROSCOPIC
Bilirubin Urine: NEGATIVE
Glucose, UA: NEGATIVE mg/dL
NITRITE: NEGATIVE
PROTEIN: 100 mg/dL — AB
Specific Gravity, Urine: 1.025 (ref 1.005–1.030)
Urobilinogen, UA: 0.2 mg/dL (ref 0.0–1.0)
pH: 6 (ref 5.0–8.0)

## 2014-12-27 LAB — CBC WITH DIFFERENTIAL/PLATELET
BASOS ABS: 0 10*3/uL (ref 0.0–0.1)
Basophils Relative: 0 %
Eosinophils Absolute: 0 10*3/uL (ref 0.0–0.7)
Eosinophils Relative: 0 %
HCT: 39.2 % (ref 36.0–46.0)
HEMOGLOBIN: 12.6 g/dL (ref 12.0–15.0)
LYMPHS ABS: 1.7 10*3/uL (ref 0.7–4.0)
LYMPHS PCT: 20 %
MCH: 26.9 pg (ref 26.0–34.0)
MCHC: 32.1 g/dL (ref 30.0–36.0)
MCV: 83.6 fL (ref 78.0–100.0)
Monocytes Absolute: 0.3 10*3/uL (ref 0.1–1.0)
Monocytes Relative: 4 %
NEUTROS ABS: 6.5 10*3/uL (ref 1.7–7.7)
NEUTROS PCT: 76 %
PLATELETS: 217 10*3/uL (ref 150–400)
RBC: 4.69 MIL/uL (ref 3.87–5.11)
RDW: 15.1 % (ref 11.5–15.5)
WBC: 8.5 10*3/uL (ref 4.0–10.5)

## 2014-12-27 LAB — URINE MICROSCOPIC-ADD ON

## 2014-12-27 LAB — BASIC METABOLIC PANEL
Anion gap: 7 (ref 5–15)
BUN: 13 mg/dL (ref 6–20)
CHLORIDE: 112 mmol/L — AB (ref 101–111)
CO2: 18 mmol/L — AB (ref 22–32)
Calcium: 8.8 mg/dL — ABNORMAL LOW (ref 8.9–10.3)
Creatinine, Ser: 0.51 mg/dL (ref 0.44–1.00)
GFR calc non Af Amer: >60 mL/min (ref >60)
Glucose, Bld: 92 mg/dL (ref 65–99)
Potassium: 3.9 mmol/L (ref 3.5–5.1)
SODIUM: 137 mmol/L (ref 135–145)

## 2014-12-27 LAB — PREGNANCY, URINE: PREG TEST UR: NEGATIVE

## 2014-12-27 LAB — RAPID URINE DRUG SCREEN, HOSP PERFORMED
AMPHETAMINES: NOT DETECTED
BENZODIAZEPINES: NOT DETECTED
Barbiturates: NOT DETECTED
Cocaine: NOT DETECTED
Opiates: NOT DETECTED
TETRAHYDROCANNABINOL: NOT DETECTED

## 2014-12-27 LAB — ETHANOL

## 2014-12-27 LAB — SALICYLATE LEVEL

## 2014-12-27 LAB — ACETAMINOPHEN LEVEL: Acetaminophen (Tylenol), Serum: 16 ug/mL (ref 10–30)

## 2014-12-27 MED ORDER — ACETAMINOPHEN 500 MG PO TABS
1000.0000 mg | ORAL_TABLET | Freq: Once | ORAL | Status: AC
  Administered 2014-12-27: 1000 mg via ORAL
  Filled 2014-12-27: qty 2

## 2014-12-27 NOTE — ED Notes (Signed)
Per record, pt seen previous for left sided head pain and auditory hallucinations, pt denies seeing previous doctor for this. Pt changes her story multiple times during assesment. Pt states that she is taking a new medication for hallucinations that was prescribed by her pcp but does not know the name of the medicine. Vss, pt alert and oriented

## 2014-12-27 NOTE — ED Provider Notes (Signed)
CSN: [id number]     Arrival date & time 12/27/14  0957 History   First MD Initiated Contact with Patient 12/27/14 1124     Chief Complaint  Patient presents with  . Headache     (Consider location/radiation/quality/duration/timing/severity/associated sxs/prior Treatment) Patient is a 34 y.o. female presenting with headaches.  Headache Pain location:  L temporal Quality:  Dull Radiates to:  Does not radiate Severity currently:  3/10 Onset quality:  Gradual Duration: intermittently for 1 year.  Timing:  Intermittent Progression:  Unchanged Context comment:  Pt has recurrent episodes of headaches.  She has had several in the past several days. Relieved by:  Nothing Worsened by:  Nothing Associated symptoms: no fever, no photophobia and no vomiting   Associated symptoms comment:  Auditory hallucinations. Room spinning dizziness and lightheaded dizziness   Past Medical History  Diagnosis Date  . Medical history non-contributory   . S/P repeat low transverse C-section 06/09/2012  . S/P tubal ligation 06/09/2012  . Postpartum care following cesarean delivery 06/09/2012   Past Surgical History  Procedure Laterality Date  . Cesarean section  2006  . Tympanoplasty  2010  . Cesarean section N/A 06/08/2012    Procedure: CESAREAN SECTION;  Surgeon: Robley FriesVaishali R [name], MD;  Location: [facility];  Service: Obstetrics;  Laterality: N/A;  Repeat C/S  EDD: 06/24/12;Twins Monochorionic diamniotic   Family History  Problem Relation Age of Onset  .      Social History  Substance Use Topics  . Smoking status: Never Smoker   . Smokeless tobacco: None  . Alcohol Use: Yes     Comment: Rarely    OB History    Gravida Para Term Preterm AB TAB SAB Ectopic Multiple Living   2 2 1      1 3      Review of Systems  Constitutional: Negative for fever.  Eyes: Negative for photophobia.  Gastrointestinal: Negative for vomiting.  Neurological: Positive for headaches.  All other systems reviewed and are  negative.     Allergies  Nubain; Scopolamine; Venlafaxine; and Latex  Home Medications   Prior to Admission medications   Medication Sig Start Date End Date Taking? Authorizing Provider  ibuprofen (ADVIL,MOTRIN) 200 MG tablet Take 200 mg by mouth every 6 (six) hours as needed.   Yes Historical Provider, MD   BP 116/69 mmHg  Pulse 95  Temp(Src) 98.1 F (36.7 C) (Oral)  Resp 18  Ht 5\' 5"  (1.651 m)  Wt 142 lb (64.411 kg)  BMI 23.63 kg/m2  SpO2 100%  LMP 12/24/2014 Physical Exam  Constitutional: She is oriented to person, place, and time. She appears well-developed and well-nourished. No distress.  HENT:  Head: Normocephalic and atraumatic.  Mouth/Throat: Oropharynx is clear and moist.  Eyes: Conjunctivae are normal. Pupils are equal, round, and reactive to light. No scleral icterus.  Neck: Neck supple.  Cardiovascular: Normal rate, regular rhythm, normal heart sounds and intact distal pulses.   No murmur heard. Pulmonary/Chest: Effort normal and breath sounds normal. No stridor. No respiratory distress. She has no rales.  Abdominal: Soft. Bowel sounds are normal. She exhibits no distension. There is no tenderness.  Musculoskeletal: Normal range of motion.  Neurological: She is alert and oriented to person, place, and time. She has normal strength. No cranial nerve deficit. Coordination normal.  Skin: Skin is warm and dry. No rash noted.  Psychiatric: She has a normal mood and affect. Her behavior is normal.  Nursing note and vitals reviewed.   ED  Course  Procedures (including critical care time) Labs Review Labs Reviewed  BASIC METABOLIC PANEL - Abnormal; Notable for the following:    Chloride 112 (*)    CO2 18 (*)    Calcium 8.8 (*)    All other components within normal limits  URINALYSIS, ROUTINE W REFLEX MICROSCOPIC (NOT AT [facility]) - Abnormal; Notable for the following:    Color, Urine AMBER (*)    APPearance CLOUDY (*)    Hgb urine dipstick LARGE (*)    Ketones,  ur >80 (*)    Protein, ur 100 (*)    Leukocytes, UA TRACE (*)    All other components within normal limits  URINE MICROSCOPIC-ADD ON - Abnormal; Notable for the following:    Bacteria, UA FEW (*)    All other components within normal limits  ETHANOL  CBC WITH DIFFERENTIAL/PLATELET  URINE RAPID DRUG SCREEN, HOSP PERFORMED  PREGNANCY, URINE  ACETAMINOPHEN LEVEL  SALICYLATE LEVEL    Imaging Review No results found. I have personally reviewed and evaluated these images and lab results as part of my medical decision-making.   EKG Interpretation None      MDM   Final diagnoses:  Nonintractable headache, unspecified chronicity pattern, unspecified headache type  Auditory hallucinations    Very difficult to get a clear history.  However, pt reports that for the last year or so, she gets frequent headaches.  She also gets hallucinations with these headaches.  She reports having a headache yesterday and forgetting to pick her son up from work.  Today, when her headache began, she felt like she might pass out, so she called the paramedics.  Now her headache is better.  When asked if her dizziness was better, she said "I'm not sure how I'm supposed to answer that".  Her husband provided additional history, stating that she has been seen by psychiatrists for this issue, started venlafaxine, but stopped it quickly due to allergy.  She did not, subsequently, follow up.  He thinks her symptoms are due to "stress".    She remained well appearing, nontoxic, and with a normal neurologic exam.  TTS consult obtained due to her hallucinations.  They did not think she needed inpatient treatment and felt that she was stable for outpatient psychiatric management.  She will also follow up with her PCP, who she says is also working up her headaches.      Blake Divine, MD 12/27/14 [phone]

## 2014-12-27 NOTE — ED Notes (Signed)
[facility], provide pt's husband with fax papers of phycologists to contact on Monday. Attempted to get urine specimen with no success. Provided pt with water and encouraged to call nursing staff when she is ready to urinate.

## 2014-12-27 NOTE — ED Notes (Signed)
Pt states she is having headache x 2 days,

## 2014-12-27 NOTE — ED Notes (Signed)
Pt's husband at bedside. Pt reports calling 911 because she had a headache and didn't feel well. Per husband report that the pt has been tearful, worried about many things in her life. These voices started after the birth of her twins. She has a very flat affect, difficulty understanding questions and gives simple answers. Pt's husband reports seeing a medical therapist for the voices and depression and neurologist for the headaches at [facility].  Currently on no medications for headaches or depression.

## 2014-12-27 NOTE — BH Assessment (Addendum)
Tele Assessment Note   [name] is an 34 y.o. female living [place] her [place] and three children in [place], [place]. Pt presents voluntarily to [facility] due to chronic headache, mild dizziness, and auditory hallucinations. Pt reports that she has been hearing voices "on and off" since the birth of her twins 2 years ago. She states that the voices are usually muffled, sound like her husband or sister, and sometimes call her name. Pt denied hearing AH during assessment and presented as oriented, clear, and with organized thought process. Pt's husband was present during assessment and stated that in the past, pt demonstrated delusions (thinking her children's helmets had microphones in them). Pt denies remembering this and does not present with delusions at this time.   Pt denies SI/HI/VH and denies depressive symptoms. Pt has been married for 10 years and has 3 children. She reports no prior mental health diagnosis and is not prescribed psych medication. Pt has PCP and neurologist that she sees due to chronic headaches-recent MRI came back negative for abnormalities. Pt is employed and reports some work stress related to strained relationships with coworkers. Pt requested d/c home in order to rest and reports feeling "very tired." Pt's husband was supportive of pt throughout assessment. CSW ran pt by Fransisca KaufmannLaura Davis NP who agreed that pt does not meet criteria for inpatient treatment and recommended referral for outpatient medication management with psychiatrist. CSW faxed list of psychiatrists that accept pt's insurance to [place], pt's [employer] fund[employer]. CSW spoke with [place] RN regarding disposition at 1:15PM on 12/27/14.   Diagnosis: Psychotic Disorder NOS  Past Medical History:  Past Medical History  Diagnosis Date  . Medical history non-contributory   . S/P repeat low transverse C-section 06/09/2012  . S/P tubal ligation 06/09/2012  . Postpartum care following cesarean delivery 06/09/2012    Past  Surgical History  Procedure Laterality Date  . Cesarean section  2006  . Tympanoplasty  2010  . Cesarean section N/A 06/08/2012    Procedure: CESAREAN SECTION;  Surgeon: Robley FriesVaishali R [name], MD;  Location: [facility];  Service: Obstetrics;  Laterality: N/A;  Repeat C/S  EDD: 06/24/12;Twins Monochorionic diamniotic    Family History:  Family History  Problem Relation Age of Onset  .       Social History:  reports that she has never smoked. She does not have any smokeless tobacco history on file. She reports that she drinks alcohol. She reports that she does not use illicit drugs.  Additional Social History:  Alcohol / Drug Use Pain Medications: none  Prescriptions: none that she is aware of  Over the Counter: n/a  History of alcohol / drug use?: No history of alcohol / drug abuse Longest period of sobriety (when/how long): n/a   CIWA: CIWA-Ar BP: 116/69 mmHg Pulse Rate: 95 COWS:    PATIENT STRENGTHS: (choose at least two) Ability for insight Average or above average intelligence Capable of independent living Communication skills General fund of knowledge Motivation for treatment/growth Physical Health Supportive family/friends Work skills  Allergies:  Allergies  Allergen Reactions  . Nubain [Nalbuphine] Itching  . Scopolamine Itching  . Venlafaxine Hives    Heart palpitations, headache, burning skin, shakiness, dry mouth, urinary issues,  . Latex Itching and Rash    Home Medications:  (Not in a hospital admission)  OB/GYN Status:  Patient's last menstrual period was 12/24/2014.  General Assessment Data Location of Assessment:  ([facility] ) TTS Assessment: In system Is this a  Tele or Face-to-Face Assessment?: Tele Assessment Is this an Initial Assessment or a Re-assessment for this encounter?: Initial Assessment Marital status: Married [place] name:  (n/a) Is patient pregnant?: No Pregnancy Status: No Living Arrangements: Spouse/significant other,  Children Can pt return to current living arrangement?: Yes Admission Status: Voluntary Is patient capable of signing voluntary admission?: Yes Referral Source: Self/Family/Friend Insurance type: AETNA  Medical Screening Exam [facility] Walk-in ONLY) Medical Exam completed: Yes  Crisis Care Plan Living Arrangements: Spouse/significant other, Children Name of Psychiatrist:  (n/a) Name of Therapist:  (n/a)  Education Status Is patient currently in school?: No Current Grade:  (n/a) Highest grade of school patient has completed: 12 Name of school:  (n/a) Contact person: patient  Risk to self with the past 6 months Suicidal Ideation: No Has patient been a risk to self within the past 6 months prior to admission? : No Suicidal Intent: No Has patient had any suicidal intent within the past 6 months prior to admission? : No Is patient at risk for suicide?: No Suicidal Plan?: No Has patient had any suicidal plan within the past 6 months prior to admission? : No Access to Means: No What has been your use of drugs/alcohol within the last 12 months?: none Previous Attempts/Gestures: No How many times?: 0 Other Self Harm Risks: none Triggers for Past Attempts:  (n/a) Intentional Self Injurious Behavior: None Family Suicide History: No Recent stressful life event(s): Other (Comment) (work relationships are stressful per pt. ) Persecutory voices/beliefs?: No Depression: No Depression Symptoms: Fatigue Substance abuse history and/or treatment for substance abuse?: No Suicide prevention information given to non-admitted patients: Yes  Risk to Others within the past 6 months Homicidal Ideation: No Does patient have any lifetime risk of violence toward others beyond the six months prior to admission? : No Thoughts of Harm to Others: No Current Homicidal Intent: No Current Homicidal Plan: No Access to Homicidal Means: No Identified Victim: none History of harm to others?: No Assessment of  Violence: None Noted Violent Behavior Description: n/a Does patient have access to weapons?: No Criminal Charges Pending?: No Does patient have a court date: No Is patient on probation?: No  Psychosis Hallucinations: Auditory (hears family member saying her name at times ) Delusions: Unspecified, Persecutory (hx of belief that microphone in child's helmet)  Mental Status Report Appearance/Hygiene: In scrubs Eye Contact: Fair Motor Activity: Unremarkable Speech: Logical/coherent Level of Consciousness: Drowsy Mood: Sullen Affect: Other (Comment) (lethargic; she said she was embarressed) Anxiety Level: Minimal Thought Processes: Relevant, Coherent Judgement: Unimpaired Orientation: Person, Place, Time, Situation, Appropriate for developmental age Obsessive Compulsive Thoughts/Behaviors: None  Cognitive Functioning Concentration: Normal Memory: Recent Intact IQ: Average Insight: Good Impulse Control: Good Appetite: Poor Weight Loss:  (none) Weight Gain:  (none) Sleep: Decreased Total Hours of Sleep: 5 Vegetative Symptoms: None  ADLScreening [facility] Assessment Services) Patient's cognitive ability adequate to safely complete daily activities?: Yes Patient able to express need for assistance with ADLs?: No Independently performs ADLs?: Yes (appropriate for developmental age)  Prior Inpatient Therapy Prior Inpatient Therapy: No Prior Therapy Dates:  (n/a) Prior Therapy Facilty/Provider(s): n/a Reason for Treatment: headache and voices  Prior Outpatient Therapy Prior Outpatient Therapy: Yes Prior Therapy Dates:  (na) Prior Therapy Facilty/Provider(s): na Reason for Treatment: headache Does patient have an ACCT team?: No Does patient have Intensive In-House Services?  : No Does patient have [facility] services? : No Does patient have P4CC services?: No  ADL Screening (condition at time of admission) Patient's cognitive ability adequate  to safely complete daily  activities?: Yes Is the patient deaf or have difficulty hearing?: No Does the patient have difficulty seeing, even when wearing glasses/contacts?: No Does the patient have difficulty concentrating, remembering, or making decisions?: No Patient able to express need for assistance with ADLs?: No Does the patient have difficulty dressing or bathing?: No Independently performs ADLs?: Yes (appropriate for developmental age) Does the patient have difficulty walking or climbing stairs?: No Weakness of Legs: None Weakness of Arms/Hands: None  Home Assistive Devices/Equipment Home Assistive Devices/Equipment: None  Therapy Consults (therapy consults require a physician order) PT Evaluation Needed: No OT Evalulation Needed: No SLP Evaluation Needed: No Abuse/Neglect Assessment (Assessment to be complete while patient is alone) Physical Abuse: Denies Verbal Abuse: Denies Sexual Abuse: Denies Exploitation of patient/patient's resources: Denies Self-Neglect: Denies Values / Beliefs Cultural Requests During Hospitalization: None Spiritual Requests During Hospitalization: None Consults Spiritual Care Consult Needed: No Social Work Consult Needed: No Merchant navy officer (For Healthcare) Does patient have an advance directive?: No Would patient like information on creating an advanced directive?: No - patient declined information    Additional Information 1:1 In Past 12 Months?: No CIRT Risk: No Elopement Risk: No Does patient have medical clearance?: Yes  Child/Adolescent Assessment Running Away Risk: Denies Bed-Wetting: Denies Destruction of Property: Denies Cruelty to Animals: Denies Stealing: Denies Rebellious/Defies Authority: Denies Satanic Involvement: Denies Archivist: Denies Problems at [employer]: Denies Gang Involvement: Denies  Disposition:  Disposition Initial Assessment Completed for this Encounter: Yes Disposition of Patient: Outpatient treatment Type of  outpatient treatment: Adult  Trula Slade LCSWA 12/27/2014 1:08 PM

## 2014-12-27 NOTE — ED Notes (Signed)
Pt states that she has had headache x 1 week, to left side, states she is hearing voices, when questioned about previous visits for auditory hallucinations pt denies

## 2014-12-27 NOTE — ED Notes (Signed)
Headache localized to right side of head, c/o abdominal pain secondary menses, states she is feeling "light headed" but no syncopal episode, vss,

## 2014-12-27 NOTE — Discharge Instructions (Signed)

## 2015-04-30 ENCOUNTER — Encounter: Payer: Self-pay | Admitting: Nurse Practitioner

## 2015-06-15 DIAGNOSIS — F333 Major depressive disorder, recurrent, severe with psychotic symptoms: Secondary | ICD-10-CM | POA: Diagnosis not present

## 2015-08-04 ENCOUNTER — Other Ambulatory Visit: Payer: BLUE CROSS/BLUE SHIELD

## 2015-08-06 ENCOUNTER — Encounter: Payer: BLUE CROSS/BLUE SHIELD | Admitting: Nurse Practitioner

## 2015-08-06 DIAGNOSIS — Z6826 Body mass index (BMI) 26.0-26.9, adult: Secondary | ICD-10-CM | POA: Diagnosis not present

## 2015-08-06 DIAGNOSIS — R87810 Cervical high risk human papillomavirus (HPV) DNA test positive: Secondary | ICD-10-CM | POA: Diagnosis not present

## 2015-08-06 DIAGNOSIS — Z1151 Encounter for screening for human papillomavirus (HPV): Secondary | ICD-10-CM | POA: Diagnosis not present

## 2015-08-06 DIAGNOSIS — Z01419 Encounter for gynecological examination (general) (routine) without abnormal findings: Secondary | ICD-10-CM | POA: Diagnosis not present

## 2015-08-17 ENCOUNTER — Other Ambulatory Visit: Payer: BLUE CROSS/BLUE SHIELD

## 2015-08-17 DIAGNOSIS — Z0000 Encounter for general adult medical examination without abnormal findings: Secondary | ICD-10-CM | POA: Diagnosis not present

## 2015-08-17 DIAGNOSIS — R5383 Other fatigue: Secondary | ICD-10-CM

## 2015-08-18 LAB — COMPREHENSIVE METABOLIC PANEL
ALT: 11 IU/L (ref 0–32)
AST: 14 IU/L (ref 0–40)
Albumin/Globulin Ratio: 1.6 (ref 1.2–2.2)
Albumin: 4.2 g/dL (ref 3.5–5.5)
Alkaline Phosphatase: 80 IU/L (ref 39–117)
BUN/Creatinine Ratio: 15 (ref 9–23)
BUN: 10 mg/dL (ref 6–20)
Bilirubin Total: 0.2 mg/dL (ref 0.0–1.2)
CO2: 24 mmol/L (ref 18–29)
CREATININE: 0.65 mg/dL (ref 0.57–1.00)
Calcium: 8.7 mg/dL (ref 8.7–10.2)
Chloride: 99 mmol/L (ref 96–106)
GFR, EST AFRICAN AMERICAN: 132 mL/min/{1.73_m2} (ref >59)
GFR, EST NON AFRICAN AMERICAN: 115 mL/min/{1.73_m2} (ref >59)
GLOBULIN, TOTAL: 2.7 g/dL (ref 1.5–4.5)
Glucose: 82 mg/dL (ref 65–99)
POTASSIUM: 4.4 mmol/L (ref 3.5–5.2)
SODIUM: 137 mmol/L (ref 134–144)
TOTAL PROTEIN: 6.9 g/dL (ref 6.0–8.5)

## 2015-08-18 LAB — CBC WITH DIFFERENTIAL/PLATELET
BASOS ABS: 0 10*3/uL (ref 0.0–0.2)
Basos: 1 %
EOS (ABSOLUTE): 0.1 10*3/uL (ref 0.0–0.4)
Eos: 1 %
HEMOGLOBIN: 11.8 g/dL (ref 11.1–15.9)
Hematocrit: 36.8 % (ref 34.0–46.6)
Immature Grans (Abs): 0 10*3/uL (ref 0.0–0.1)
Immature Granulocytes: 0 %
LYMPHS ABS: 2.5 10*3/uL (ref 0.7–3.1)
Lymphs: 43 %
MCH: 26.1 pg — AB (ref 26.6–33.0)
MCHC: 32.1 g/dL (ref 31.5–35.7)
MCV: 81 fL (ref 79–97)
MONOCYTES: 5 %
MONOS ABS: 0.3 10*3/uL (ref 0.1–0.9)
NEUTROS ABS: 2.9 10*3/uL (ref 1.4–7.0)
Neutrophils: 50 %
PLATELETS: 217 10*3/uL (ref 150–379)
RBC: 4.52 x10E6/uL (ref 3.77–5.28)
RDW: 15.3 % (ref 12.3–15.4)
WBC: 5.9 10*3/uL (ref 3.4–10.8)

## 2015-08-18 LAB — TSH: TSH: 2.12 u[IU]/mL (ref 0.450–4.500)

## 2015-08-18 LAB — LIPID PANEL
CHOL/HDL RATIO: 4.1 ratio (ref 0.0–4.4)
Cholesterol, Total: 178 mg/dL (ref 100–199)
HDL: 43 mg/dL (ref >39)
LDL CALC: 109 mg/dL — AB (ref 0–99)
Triglycerides: 132 mg/dL (ref 0–149)
VLDL Cholesterol Cal: 26 mg/dL (ref 5–40)

## 2015-08-19 DIAGNOSIS — F333 Major depressive disorder, recurrent, severe with psychotic symptoms: Secondary | ICD-10-CM | POA: Diagnosis not present

## 2015-08-20 ENCOUNTER — Encounter: Payer: Self-pay | Admitting: Nurse Practitioner

## 2015-08-20 ENCOUNTER — Ambulatory Visit (INDEPENDENT_AMBULATORY_CARE_PROVIDER_SITE_OTHER): Payer: BLUE CROSS/BLUE SHIELD | Admitting: Nurse Practitioner

## 2015-08-20 VITALS — BP 112/64 | HR 71 | Temp 97.8°F | Resp 18 | Ht 65.0 in | Wt 155.4 lb

## 2015-08-20 DIAGNOSIS — Z0000 Encounter for general adult medical examination without abnormal findings: Secondary | ICD-10-CM | POA: Diagnosis not present

## 2015-08-20 NOTE — Patient Instructions (Signed)
Labs faxed to Dr August Albino office   Decrease sugar intake. Make sure you are getting 30 mins/5 days a week of exercise     Health Maintenance, Female Adopting a healthy lifestyle and getting preventive care can go a long way to promote health and wellness. Talk with your health care provider about what schedule of regular examinations is right for you. This is a good chance for you to check in with your provider about disease prevention and staying healthy. In between checkups, there are plenty of things you can do on your own. Experts have done a lot of research about which lifestyle changes and preventive measures are most likely to keep you healthy. Ask your health care provider for more information. WEIGHT AND DIET  Eat a healthy diet  Be sure to include plenty of vegetables, fruits, low-fat dairy products, and lean protein.  Do not eat a lot of foods high in solid fats, added sugars, or salt.  Get regular exercise. This is one of the most important things you can do for your health.  Most adults should exercise for at least 150 minutes each week. The exercise should increase your heart rate and make you sweat (moderate-intensity exercise).  Most adults should also do strengthening exercises at least twice a week. This is in addition to the moderate-intensity exercise.  Maintain a healthy weight  Body mass index (BMI) is a measurement that can be used to identify possible weight problems. It estimates body fat based on height and weight. Your health care provider can help determine your BMI and help you achieve or maintain a healthy weight.  For females 67 years of age and older:   A BMI below 18.5 is considered underweight.  A BMI of 18.5 to 24.9 is normal.  A BMI of 25 to 29.9 is considered overweight.  A BMI of 30 and above is considered obese.  Watch levels of cholesterol and blood lipids  You should start having your blood tested for lipids and cholesterol at 36 years of age,  then have this test every 5 years.  You may need to have your cholesterol levels checked more often if:  Your lipid or cholesterol levels are high.  You are older than 36 years of age.  You are at high risk for heart disease.  CANCER SCREENING   Lung Cancer  Lung cancer screening is recommended for adults 41-39 years old who are at high risk for lung cancer because of a history of smoking.  A yearly low-dose CT scan of the lungs is recommended for people who:  Currently smoke.  Have quit within the past 15 years.  Have at least a 30-pack-year history of smoking. A pack year is smoking an average of one pack of cigarettes a day for 1 year.  Yearly screening should continue until it has been 15 years since you quit.  Yearly screening should stop if you develop a health problem that would prevent you from having lung cancer treatment.  Breast Cancer  Practice breast self-awareness. This means understanding how your breasts normally appear and feel.  It also means doing regular breast self-exams. Let your health care provider know about any changes, no matter how small.  If you are in your 20s or 30s, you should have a clinical breast exam (CBE) by a health care provider every 1-3 years as part of a regular health exam.  If you are 48 or older, have a CBE every year. Also consider having a  breast X-ray (mammogram) every year.  If you have a family history of breast cancer, talk to your health care provider about genetic screening.  If you are at high risk for breast cancer, talk to your health care provider about having an MRI and a mammogram every year.  Breast cancer gene (BRCA) assessment is recommended for women who have family members with BRCA-related cancers. BRCA-related cancers include:  Breast.  Ovarian.  Tubal.  Peritoneal cancers.  Results of the assessment will determine the need for genetic counseling and BRCA1 and BRCA2 testing. Cervical Cancer Your  health care provider may recommend that you be screened regularly for cancer of the pelvic organs (ovaries, uterus, and vagina). This screening involves a pelvic examination, including checking for microscopic changes to the surface of your cervix (Pap test). You may be encouraged to have this screening done every 3 years, beginning at age 36.  For women ages 84-65, health care providers may recommend pelvic exams and Pap testing every 3 years, or they may recommend the Pap and pelvic exam, combined with testing for human papilloma virus (HPV), every 5 years. Some types of HPV increase your risk of cervical cancer. Testing for HPV may also be done on women of any age with unclear Pap test results.  Other health care providers may not recommend any screening for nonpregnant women who are considered low risk for pelvic cancer and who do not have symptoms. Ask your health care provider if a screening pelvic exam is right for you.  If you have had past treatment for cervical cancer or a condition that could lead to cancer, you need Pap tests and screening for cancer for at least 20 years after your treatment. If Pap tests have been discontinued, your risk factors (such as having a new sexual partner) need to be reassessed to determine if screening should resume. Some women have medical problems that increase the chance of getting cervical cancer. In these cases, your health care provider may recommend more frequent screening and Pap tests. Colorectal Cancer  This type of cancer can be detected and often prevented.  Routine colorectal cancer screening usually begins at 36 years of age and continues through 36 years of age.  Your health care provider may recommend screening at an earlier age if you have risk factors for colon cancer.  Your health care provider may also recommend using home test kits to check for hidden blood in the stool.  A small camera at the end of a tube can be used to examine your  colon directly (sigmoidoscopy or colonoscopy). This is done to check for the earliest forms of colorectal cancer.  Routine screening usually begins at age 14.  Direct examination of the colon should be repeated every 5-10 years through 36 years of age. However, you may need to be screened more often if early forms of precancerous polyps or small growths are found. Skin Cancer  Check your skin from head to toe regularly.  Tell your health care provider about any new moles or changes in moles, especially if there is a change in a mole's shape or color.  Also tell your health care provider if you have a mole that is larger than the size of a pencil eraser.  Always use sunscreen. Apply sunscreen liberally and repeatedly throughout the day.  Protect yourself by wearing long sleeves, pants, a wide-brimmed hat, and sunglasses whenever you are outside. HEART DISEASE, DIABETES, AND HIGH BLOOD PRESSURE   High blood pressure causes  heart disease and increases the risk of stroke. High blood pressure is more likely to develop in:  People who have blood pressure in the high end of the normal range (130-139/85-89 mm Hg).  People who are overweight or obese.  People who are African American.  If you are 84-34 years of age, have your blood pressure checked every 3-5 years. If you are 47 years of age or older, have your blood pressure checked every year. You should have your blood pressure measured twice--once when you are at a hospital or clinic, and once when you are not at a hospital or clinic. Record the average of the two measurements. To check your blood pressure when you are not at a hospital or clinic, you can use:  An automated blood pressure machine at a pharmacy.  A home blood pressure monitor.  If you are between 70 years and 11 years old, ask your health care provider if you should take aspirin to prevent strokes.  Have regular diabetes screenings. This involves taking a blood sample to  check your fasting blood sugar level.  If you are at a normal weight and have a low risk for diabetes, have this test once every three years after 36 years of age.  If you are overweight and have a high risk for diabetes, consider being tested at a younger age or more often. PREVENTING INFECTION  Hepatitis B  If you have a higher risk for hepatitis B, you should be screened for this virus. You are considered at high risk for hepatitis B if:  You were born in a country where hepatitis B is common. Ask your health care provider which countries are considered high risk.  Your parents were born in a high-risk country, and you have not been immunized against hepatitis B (hepatitis B vaccine).  You have HIV or AIDS.  You use needles to inject street drugs.  You live with someone who has hepatitis B.  You have had sex with someone who has hepatitis B.  You get hemodialysis treatment.  You take certain medicines for conditions, including cancer, organ transplantation, and autoimmune conditions. Hepatitis C  Blood testing is recommended for:  Everyone born from [birth year] through 1966.  Anyone with known risk factors for hepatitis C. Sexually transmitted infections (STIs)  You should be screened for sexually transmitted infections (STIs) including gonorrhea and chlamydia if:  You are sexually active and are younger than 36 years of age.  You are older than 36 years of age and your health care provider tells you that you are at risk for this type of infection.  Your sexual activity has changed since you were last screened and you are at an increased risk for chlamydia or gonorrhea. Ask your health care provider if you are at risk.  If you do not have HIV, but are at risk, it may be recommended that you take a prescription medicine daily to prevent HIV infection. This is called pre-exposure prophylaxis (PrEP). You are considered at risk if:  You are sexually active and do not regularly use  condoms or know the HIV status of your partner(s).  You take drugs by injection.  You are sexually active with a partner who has HIV. Talk with your health care provider about whether you are at high risk of being infected with HIV. If you choose to begin PrEP, you should first be tested for HIV. You should then be tested every 3 months for as long as you are  taking PrEP.  PREGNANCY   If you are premenopausal and you may become pregnant, ask your health care provider about preconception counseling.  If you may become pregnant, take 400 to 800 micrograms (mcg) of folic acid every day.  If you want to prevent pregnancy, talk to your health care provider about birth control (contraception). OSTEOPOROSIS AND MENOPAUSE   Osteoporosis is a disease in which the bones lose minerals and strength with aging. This can result in serious bone fractures. Your risk for osteoporosis can be identified using a bone density scan.  If you are 33 years of age or older, or if you are at risk for osteoporosis and fractures, ask your health care provider if you should be screened.  Ask your health care provider whether you should take a calcium or vitamin D supplement to lower your risk for osteoporosis.  Menopause may have certain physical symptoms and risks.  Hormone replacement therapy may reduce some of these symptoms and risks. Talk to your health care provider about whether hormone replacement therapy is right for you.  HOME CARE INSTRUCTIONS   Schedule regular health, dental, and eye exams.  Stay current with your immunizations.   Do not use any tobacco products including cigarettes, chewing tobacco, or electronic cigarettes.  If you are pregnant, do not drink alcohol.  If you are breastfeeding, limit how much and how often you drink alcohol.  Limit alcohol intake to no more than 1 drink per day for nonpregnant women. One drink equals 12 ounces of beer, 5 ounces of wine, or 1 ounces of hard  liquor.  Do not use street drugs.  Do not share needles.  Ask your health care provider for help if you need support or information about quitting drugs.  Tell your health care provider if you often feel depressed.  Tell your health care provider if you have ever been abused or do not feel safe at home.   This information is not intended to replace advice given to you by your health care provider. Make sure you discuss any questions you have with your health care provider.   Document Released: 09/06/2010 Document Revised: 03/14/2014 Document Reviewed: 01/23/2013 Elsevier Interactive Patient Education [employer].

## 2015-08-20 NOTE — Progress Notes (Signed)
Patient ID: Kelly [name], female   DOB: 07/28/1980, 35 y.o.   MRN: [id number]    PCP: [name]  Advanced Directive information Does patient have an advance directive?: No  Allergies  Allergen Reactions  . Nubain [Nalbuphine] Itching  . Scopolamine Itching  . Venlafaxine Hives    Heart palpitations, headache, burning skin, shakiness, dry mouth, urinary issues,  . Latex Itching and Rash    Chief Complaint  Patient presents with  . Medical Management of Chronic Issues    Annual physical  . OTHER    discuss labs (labs printed)  . OTHER    Abnormal vaginal bleeding (followed by GYN)     HPI: Patient is a 35 y.o. female seen in the office today for yearly physical. Just had GYN appt and had pap and breast exam. Being worked up for abnormal bleeding by [name] Following with psychiatrist for depression and mood has been much better since on medications.  Screenings: Breast Cancer- with GYN Cervical Cancer- with GYN Depression screening [facility] 08/20/2015 07/31/2014 04/23/2014 03/27/2013  Decreased Interest 0 0 0 0  Down, Depressed, Hopeless 0 1 0 0  PHQ - 2 Score 0 1 0 0   Falls Fall Risk  08/20/2015 11/04/2014 10/02/2013  Falls in the past year? No No No   MMSE No flowsheet data found. Vaccines Immunization History  Administered Date(s) Administered  . Influenza Split 12/12/2011  . Influenza,inj,Quad PF,36+ Mos 03/27/2013  . Tdap 04/23/2012    Smoking status:.none Alcohol use: none  Dentist: has not gone routinely, but plans to  Ophthalmologist: yearly  Exercise regimen: -brisk walk 30-45 mins 5 days a week Diet: has recently looked into diet changes    Review of Systems:  Review of Systems  Constitutional: Positive for fatigue. Negative for activity change, appetite change and unexpected weight change.  HENT: Negative for congestion and hearing loss.   Respiratory: Negative for cough and shortness of breath.   Cardiovascular:  Negative for chest pain, palpitations and leg swelling.  Gastrointestinal: Negative for abdominal pain, diarrhea and constipation.  Genitourinary: Negative for dysuria and difficulty urinating.  Musculoskeletal: Negative for myalgias and arthralgias.  Skin: Negative for color change and wound.  Neurological: Negative for dizziness and weakness.  Psychiatric/Behavioral: Negative for behavioral problems, confusion and agitation.    Past Medical History  Diagnosis Date  . Medical history non-contributory   . S/P repeat low transverse C-section 06/09/2012  . S/P tubal ligation 06/09/2012  . Postpartum care following cesarean delivery 06/09/2012   Past Surgical History  Procedure Laterality Date  . Cesarean section  2006  . Tympanoplasty  2010  . Cesarean section N/A 06/08/2012    Procedure: CESAREAN SECTION;  Surgeon: Robley FriesVaishali R [name], MD;  Location: [facility];  Service: Obstetrics;  Laterality: N/A;  Repeat C/S  EDD: 06/24/12;Twins Monochorionic diamniotic   Social History:   reports that she has never smoked. She does not have any smokeless tobacco history on file. She reports that she drinks alcohol. She reports that she does not use illicit drugs.  Family History  Problem Relation Age of Onset  .       Medications: Patient's Medications  New Prescriptions   No medications on file  Previous Medications   ESCITALOPRAM (LEXAPRO) 20 MG TABLET       REXULTI 1 MG TABS      Modified Medications   No medications on file  Discontinued Medications   IBUPROFEN (ADVIL,MOTRIN) 200 MG TABLET  Take 200 mg by mouth every 6 (six) hours as needed.     Physical Exam:  Filed Vitals:   08/20/15 1049  BP: 112/64  Pulse: 71  Temp: 97.8 F (36.6 C)  TempSrc: Oral  Resp: 18  Height:  (1.651 m)  Weight: 155 lb 6.4 oz (70.489 kg)   Body mass index is 25.86 kg/(m^2).  Physical Exam  Constitutional: She is oriented to person, place, and time. She appears well-developed and well-nourished.  No distress.  HENT:  Head: Atraumatic.  Right Ear: External ear normal.  Left Ear: External ear normal.  Nose: Nose normal.  Mouth/Throat: Oropharynx is clear and moist. No oropharyngeal exudate.  Eyes: Conjunctivae are normal. Pupils are equal, round, and reactive to light.  Neck: Normal range of motion. Neck supple.  Cardiovascular: Normal rate, regular rhythm, normal heart sounds and intact distal pulses.   Pulmonary/Chest: Effort normal and breath sounds normal.  Abdominal: Soft. Bowel sounds are normal.  Genitourinary:  Done by GYN  Musculoskeletal: She exhibits no edema or tenderness.  Neurological: She is alert and oriented to person, place, and time. She has normal reflexes.  Skin: Skin is warm and dry. She is not diaphoretic.  Psychiatric: She has a normal mood and affect.    Labs reviewed: Basic Metabolic Panel:  Recent Labs  40/34/74 1135 08/17/15 0817  NA 137 137  Fernandez 3.9 4.4  CL 112* 99  CO2 18* 24  GLUCOSE 92 82  BUN 13 10  CREATININE 0.51 0.65  CALCIUM 8.8* 8.7  TSH  --  2.120   Liver Function Tests:  Recent Labs  08/17/15 0817  AST 14  ALT 11  ALKPHOS 80  BILITOT 0.2  PROT 6.9  ALBUMIN 4.2   No results for input(s): LIPASE, AMYLASE in the last 8760 hours. No results for input(s): AMMONIA in the last 8760 hours. CBC:  Recent Labs  12/27/14 1100 08/17/15 0817  WBC 8.5 5.9  NEUTROABS 6.5 2.9  HGB 12.6  --   HCT 39.2 36.8  MCV 83.6 81  PLT 217 217   Lipid Panel:  Recent Labs  08/17/15 0817  CHOL 178  HDL 43  LDLCALC 109*  TRIG 132  CHOLHDL 4.1   TSH:  Recent Labs  08/17/15 0817  TSH 2.120   A1C: No results found for: HGBA1C   Assessment/Plan 1. Preventative health care The patient is doing well and no distinct problems were identified on exam. - The patient was counseled regarding the appropriate use of alcohol, regular self-examination of the breasts on a monthly basis, prevention of dental and periodontal disease,  diet, regular sustained exercise for at least 30 minutes 5 times per week, routine screening interval for mammogram as recommended by the American Cancer Society and ACOG, importance of regular PAP smears, and recommended schedule for GI hemoccult testing, colonoscopy, cholesterol, thyroid and diabetes screening. -following with GYN routinely -educated on proper diet- heart healthy, to decrease sugar intake  Follow up in 1 year with lab work prior to appt.     Janene Harvey. Biagio Borg  [facility] [phone] 8 am - 5 pm) [phone] (after hours)

## 2015-08-25 DIAGNOSIS — N926 Irregular menstruation, unspecified: Secondary | ICD-10-CM | POA: Diagnosis not present

## 2015-10-12 DIAGNOSIS — F333 Major depressive disorder, recurrent, severe with psychotic symptoms: Secondary | ICD-10-CM | POA: Diagnosis not present

## 2016-01-04 DIAGNOSIS — F333 Major depressive disorder, recurrent, severe with psychotic symptoms: Secondary | ICD-10-CM | POA: Diagnosis not present

## 2016-03-29 DIAGNOSIS — F333 Major depressive disorder, recurrent, severe with psychotic symptoms: Secondary | ICD-10-CM | POA: Diagnosis not present

## 2016-05-30 DIAGNOSIS — F333 Major depressive disorder, recurrent, severe with psychotic symptoms: Secondary | ICD-10-CM | POA: Diagnosis not present

## 2016-07-25 DIAGNOSIS — F333 Major depressive disorder, recurrent, severe with psychotic symptoms: Secondary | ICD-10-CM | POA: Diagnosis not present

## 2016-08-25 ENCOUNTER — Encounter: Payer: Self-pay | Admitting: Nurse Practitioner

## 2016-08-25 ENCOUNTER — Encounter: Payer: BLUE CROSS/BLUE SHIELD | Admitting: Nurse Practitioner

## 2016-11-01 DIAGNOSIS — F333 Major depressive disorder, recurrent, severe with psychotic symptoms: Secondary | ICD-10-CM | POA: Diagnosis not present

## 2016-11-23 ENCOUNTER — Encounter: Payer: Self-pay | Admitting: Nurse Practitioner

## 2016-11-29 DIAGNOSIS — F333 Major depressive disorder, recurrent, severe with psychotic symptoms: Secondary | ICD-10-CM | POA: Diagnosis not present

## 2016-12-13 ENCOUNTER — Encounter: Payer: Self-pay | Admitting: Nurse Practitioner

## 2016-12-13 ENCOUNTER — Ambulatory Visit (INDEPENDENT_AMBULATORY_CARE_PROVIDER_SITE_OTHER): Payer: BLUE CROSS/BLUE SHIELD | Admitting: Nurse Practitioner

## 2016-12-13 VITALS — BP 110/72 | HR 73 | Temp 98.5°F | Resp 18 | Ht 65.0 in | Wt 155.6 lb

## 2016-12-13 DIAGNOSIS — Z23 Encounter for immunization: Secondary | ICD-10-CM | POA: Diagnosis not present

## 2016-12-13 DIAGNOSIS — R0683 Snoring: Secondary | ICD-10-CM

## 2016-12-13 DIAGNOSIS — Z0000 Encounter for general adult medical examination without abnormal findings: Secondary | ICD-10-CM

## 2016-12-13 NOTE — Patient Instructions (Signed)
Please have labs faxed to our office.  [phone]  Referral placed for evaluation of sleep apnea

## 2016-12-13 NOTE — Progress Notes (Signed)
Provider: Sharon Seller, NP  Patient Care Team: Sharon Seller, NP as PCP - General (Nurse Practitioner) Shea Evans, MD as Consulting Physician (Obstetrics and Gynecology)  Extended Emergency Contact Information Primary Emergency Contact: Sowash,Prashim Address: [address]             [place], [place] [address] [place] Home Phone: [phone] Mobile Phone: [phone] Relation: Spouse Allergies  Allergen Reactions  . Nubain [Nalbuphine] Itching  . Scopolamine Itching  . Venlafaxine Hives    Heart palpitations, headache, burning skin, shakiness, dry mouth, urinary issues,  . Latex Itching and Rash   Code Status: FULL Goals of Care: Advanced Directive information Advanced Directives 12/13/2016  Does Patient Have a Medical Advance Directive? No  Would patient like information on creating a medical advance directive? -     Chief Complaint  Patient presents with  . Medical Management of Chronic Issues    Pt is being seen for a complete physical and follow up on chronic conditions. Pt would like to discuss possible sleep apnea    HPI: Patient is a 36 y.o. female seen in today for an annual wellness exam.   Major illnesses or hospitalization in the last year- none Sister in law is a doctor and she was on vacation with her and she does not "snore normally" stops breathing wants to be evaluated for sleep apnea.   going to psychiatrist due to hearing voices now on  latuda and lexapro that Dr Shela Commons prescribes, still hears these voices but now they are muffled, and do not bother her.   Depression screen [facility] 2/9 12/13/2016 08/20/2015 07/31/2014 04/23/2014 03/27/2013  Decreased Interest 0 0 0 0 0  Down, Depressed, Hopeless 0 0 1 0 0  PHQ - 2 Score 0 0 1 0 0    Fall Risk  12/13/2016 08/20/2015 11/04/2014 10/02/2013  Falls in the past year? No No No No   No flowsheet data found.   Health Maintenance  Topic Date Due  . INFLUENZA VACCINE  10/05/2016  .  PAP SMEAR  08/14/2018  . TETANUS/TDAP  04/23/2022  . HIV Screening  Completed   Dental: rarely goes Diet? Has gained weight but recently lost 10 lbs, being more mindful of what she is eating. Low carb/no carb.  Exercise? None Ophthalmology? Yearly   Did lab work with [facility] months back-not fasting today.   Past Medical History:  Diagnosis Date  . Medical history non-contributory   . Postpartum care following cesarean delivery 06/09/2012  . S/P repeat low transverse C-section 06/09/2012  . S/P tubal ligation 06/09/2012    Past Surgical History:  Procedure Laterality Date  . CESAREAN SECTION  2006  . CESAREAN SECTION N/A 06/08/2012   Procedure: CESAREAN SECTION;  Surgeon: Robley Fries, MD;  Location: [facility];  Service: Obstetrics;  Laterality: N/A;  Repeat C/S  EDD: 06/24/12;Twins Monochorionic diamniotic  . TYMPANOPLASTY  2010    Social History   Social History  . Marital status: Married    Spouse name: N/A  . Number of children: N/A  . Years of education: N/A   Social History Main Topics  . Smoking status: Never Smoker  . Smokeless tobacco: Never Used  . Alcohol use Yes     Comment: Rarely   . Drug use: No  . Sexual activity: Yes   Other Topics Concern  . None   Social History Narrative  . None    History reviewed. No pertinent family  history.  Review of Systems:  Review of Systems  Constitutional: Negative for activity change, appetite change, fatigue and unexpected weight change.  HENT: Negative for congestion and hearing loss.   Respiratory: Negative for cough and shortness of breath.   Cardiovascular: Negative for chest pain, palpitations and leg swelling.  Gastrointestinal: Negative for abdominal pain, constipation and diarrhea.  Genitourinary: Negative for difficulty urinating and dysuria.  Musculoskeletal: Negative for arthralgias and myalgias.  Skin: Negative for color change and wound.  Neurological: Negative for dizziness and weakness.    Psychiatric/Behavioral: Negative for agitation, behavioral problems and confusion.     Allergies as of 12/13/2016      Reactions   Nubain [nalbuphine] Itching   Scopolamine Itching   Venlafaxine Hives   Heart palpitations, headache, burning skin, shakiness, dry mouth, urinary issues,   Latex Itching, Rash      Medication List       Accurate as of 12/13/16 11:12 AM. Always use your most recent med list.          escitalopram 20 MG tablet Commonly known as:  LEXAPRO Take 20 mg by mouth daily.   LATUDA 40 MG Tabs tablet Generic drug:  lurasidone Take 1 tablet by mouth daily.         Physical Exam: Vitals:   12/13/16 1107  BP: 110/72  Pulse: 73  Resp: 18  Temp: 98.5 F (36.9 C)  TempSrc: Oral  SpO2: 97%  Weight: 155 lb 9.6 oz (70.6 kg)  Height:  (1.651 m)   Body mass index is 25.89 kg/m. Physical Exam  Constitutional: She is oriented to person, place, and time. She appears well-developed and well-nourished. No distress.  HENT:  Head: Atraumatic.  Right Ear: External ear normal.  Left Ear: External ear normal.  Nose: Nose normal.  Mouth/Throat: Oropharynx is clear and moist. No oropharyngeal exudate.  Eyes: Pupils are equal, round, and reactive to light. Conjunctivae are normal.  Neck: Normal range of motion. Neck supple.  Cardiovascular: Normal rate, regular rhythm, normal heart sounds and intact distal pulses.   Pulmonary/Chest: Effort normal and breath sounds normal.  Abdominal: Soft. Bowel sounds are normal.  Genitourinary:  Genitourinary Comments: Done by GYN  Musculoskeletal: She exhibits no edema or tenderness.  Neurological: She is alert and oriented to person, place, and time. She has normal reflexes.  Skin: Skin is warm and dry. She is not diaphoretic.  Psychiatric: She has a normal mood and affect.    Labs reviewed: Basic Metabolic Panel: No results for input(s): NA, K, CL, CO2, GLUCOSE, BUN, CREATININE, CALCIUM, MG, PHOS, TSH in the  last 8760 hours. Liver Function Tests: No results for input(s): AST, ALT, ALKPHOS, BILITOT, PROT, ALBUMIN in the last 8760 hours. No results for input(s): LIPASE, AMYLASE in the last 8760 hours. No results for input(s): AMMONIA in the last 8760 hours. CBC: No results for input(s): WBC, NEUTROABS, HGB, HCT, MCV, PLT in the last 8760 hours. Lipid Panel: No results for input(s): CHOL, HDL, LDLCALC, TRIG, CHOLHDL, LDLDIRECT in the last 8760 hours. No results found for: HGBA1C  Procedures: No results found.  Assessment/Plan  1. Need for immunization against influenza - Flu Vaccine QUAD 6+ mos PF IM (Fluarix Quad PF)  2. Wellness examination Followed with GYN for PAP and breast exam.  -concerned over sleep apnea, referral for pulmonary placed.  - anxiety and depression controlled, seeing psych, however do not have these records -pt to have labs faxed to office.  The patient was counseled  regarding the appropriate use of alcohol, regular self-examination of the breasts on a monthly basis, prevention of dental and periodontal disease, diet, regular sustained exercise for at least 30 minutes 5 times per week, and recommended schedule for GI hemoccult testing, colonoscopy, cholesterol, thyroid and diabetes screening.  3. Snoring - Ambulatory referral to Pulmonology for evaluation of sleep apnea    Next appt: year; sooner if needed Jessica K. Biagio Borg  [facility] [phone] 8 am - 5 pm) [phone] (after hours)

## 2016-12-27 DIAGNOSIS — F333 Major depressive disorder, recurrent, severe with psychotic symptoms: Secondary | ICD-10-CM | POA: Diagnosis not present

## 2017-01-24 DIAGNOSIS — F333 Major depressive disorder, recurrent, severe with psychotic symptoms: Secondary | ICD-10-CM | POA: Diagnosis not present

## 2017-02-10 ENCOUNTER — Encounter: Payer: Self-pay | Admitting: Pulmonary Disease

## 2017-02-10 ENCOUNTER — Ambulatory Visit: Payer: BLUE CROSS/BLUE SHIELD | Admitting: Pulmonary Disease

## 2017-02-10 VITALS — BP 114/60 | HR 88 | Ht 65.0 in | Wt 159.0 lb

## 2017-02-10 DIAGNOSIS — Z9989 Dependence on other enabling machines and devices: Secondary | ICD-10-CM

## 2017-02-10 DIAGNOSIS — R0683 Snoring: Secondary | ICD-10-CM

## 2017-02-10 DIAGNOSIS — G4733 Obstructive sleep apnea (adult) (pediatric): Secondary | ICD-10-CM | POA: Insufficient documentation

## 2017-02-10 MED ORDER — AZELASTINE-FLUTICASONE 137-50 MCG/ACT NA SUSP: 1.0000 | Freq: Every day | NASAL | 0 refills | Status: DC

## 2017-02-10 NOTE — Progress Notes (Signed)
Subjective:    Patient ID: Kelly MajorShivani Mungia, female    DOB: 06/10/1980, 36 y.o.   MRN: [id number]  HPI  Chief Complaint  Patient presents with  . Sleep Consult    Per patient, she snores loudly. Per sister, she appears to stop breathing at night.     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 Loud snoring has been noted by her husband for many years.  [place] was [place] and her sister-in-law is a doctor commented on her snoring and advised her to seek evaluation for sleep apnea. She is under treatment for anxiety and depression and has gained 40 pounds in the last 2 years.  She reports increased dreams but does not report acting out her dreams. Epworth sleepiness score is 12 and she reports sleepiness on long drives especially on her drive back from work and more as well. Bedtime is around 10 PM, sleep latency is 30 minutes, she sleeps on her right side with 2 pillows, on work days wakes up around 5 AM feeling tired with dryness of mouth and occasional headache rarely once a month.  On weekends he will sleep late around 1 AM and be up around 8 AM. She is married with 3 kids including 37-year-old twins. She denies excessive use of caffeinated beverages and only has a coffee in the morning. She was diagnosed in the past with trigeminal neuralgia but this is not seem to be a problem now.  She reports occasional seasonal allergies but is not on using any medications for this  There is no history suggestive of cataplexy, sleep paralysis or parasomnias   Past Medical History:  Diagnosis Date  . Medical history non-contributory   . Postpartum care following cesarean delivery 06/09/2012  . S/P repeat low transverse C-section 06/09/2012  . S/P tubal ligation 06/09/2012   Past Surgical History:  Procedure Laterality Date  . CESAREAN SECTION  2006  . CESAREAN SECTION N/A 06/08/2012   Procedure: CESAREAN  SECTION;  Surgeon: Robley FriesVaishali R [name], MD;  Location: [facility];  Service: Obstetrics;  Laterality: N/A;  Repeat C/S  EDD: 06/24/12;Twins Monochorionic diamniotic  . TYMPANOPLASTY  2010     Allergies  Allergen Reactions  . Nubain [Nalbuphine] Itching  . Scopolamine Itching  . Venlafaxine Hives    Heart palpitations, headache, burning skin, shakiness, dry mouth, urinary issues,  . Latex Itching and Rash    Social History   Socioeconomic History  . Marital status: Married    Spouse name: Not on file  . Number of children: Not on file  . Years of education: Not on file  . Highest education level: Not on file  Social Needs  . Financial resource strain: Not on file  . Food insecurity - worry: Not on file  . Food insecurity - inability: Not on file  . Transportation needs - medical: Not on file  . Transportation needs - non-medical: Not on file  Occupational History  . Not on file  Tobacco Use  . Smoking status: Never Smoker  . Smokeless tobacco: Never Used  Substance and Sexual Activity  . Alcohol use: Yes    Comment: Rarely   . Drug use: No  . Sexual activity: Yes  Other Topics Concern  . Not on file  Social History Narrative  . Not on file     No family history on file.   Review of Systems Positive for nasal congestion and earache  Constitutional: negative for anorexia, fevers and sweats  Eyes: negative for irritation, redness and visual disturbance  Ears, nose, mouth, throat, and face: negative for earaches, epistaxis, nasal congestion and sore throat  Respiratory: negative for cough, dyspnea on exertion, sputum and wheezing  Cardiovascular: negative for chest pain, dyspnea, lower extremity edema, orthopnea, palpitations and syncope  Gastrointestinal: negative for abdominal pain, constipation, diarrhea, melena, nausea and vomiting  Genitourinary:negative for dysuria, frequency and hematuria  Hematologic/lymphatic: negative for bleeding, easy bruising and  lymphadenopathy  Musculoskeletal:negative for arthralgias, muscle weakness and stiff joints  Neurological: negative for coordination problems, gait problems, headaches and weakness  Endocrine: negative for diabetic symptoms including polydipsia, polyuria and weight loss     Objective:   Physical Exam  Gen. Pleasant, in no distress, normal affect ENT - , no post nasal drip, class 2 airway Neck: No JVD, no thyromegaly, no carotid bruits Lungs: no use of accessory muscles, no dullness to percussion, decreased without rales or rhonchi  Cardiovascular: Rhythm regular, heart sounds  normal, no murmurs or gallops, no peripheral edema Abdomen: soft and non-tender, no hepatosplenomegaly, BS normal. Musculoskeletal: No deformities, no cyanosis or clubbing Neuro:  alert, non focal, no tremors       Assessment & Plan:

## 2017-02-10 NOTE — Assessment & Plan Note (Signed)
Given excessive daytime somnolence, narrow pharyngeal exam, witnessed apneas & loud snoring, obstructive sleep apnea is very likely & an overnight polysomnogram will be scheduled as a home study. The pathophysiology of obstructive sleep apnea , it's cardiovascular consequences & modes of treatment including CPAP were discused with the patient in detail & they evidenced understanding.  Home sleep study pretest probability is intermediate- Sample of dymista - 1 spray each nare

## 2017-02-10 NOTE — Patient Instructions (Signed)
Home sleep study Sample of dymista - 1 spray each nare

## 2017-03-08 DIAGNOSIS — G4733 Obstructive sleep apnea (adult) (pediatric): Secondary | ICD-10-CM | POA: Diagnosis not present

## 2017-03-09 ENCOUNTER — Telehealth: Payer: Self-pay | Admitting: Pulmonary Disease

## 2017-03-09 DIAGNOSIS — G4733 Obstructive sleep apnea (adult) (pediatric): Secondary | ICD-10-CM | POA: Diagnosis not present

## 2017-03-09 NOTE — Telephone Encounter (Signed)
Per RA, HST showed severe OSA with 68 events per hour. Recommends auto cpap 5-12cm, nasal pillows. DL and OV in 4 weeks.

## 2017-03-10 ENCOUNTER — Other Ambulatory Visit: Payer: Self-pay | Admitting: *Deleted

## 2017-03-10 DIAGNOSIS — G4733 Obstructive sleep apnea (adult) (pediatric): Secondary | ICD-10-CM

## 2017-03-10 NOTE — Telephone Encounter (Signed)
Spoke with patient. She is aware of RA's recs. Wishes to proceed with CPAP therapy. Order has been placed. She has been scheduled with RA for 04/19/17 at 415.   Nothing else needed at time of call.

## 2017-03-20 DIAGNOSIS — G4733 Obstructive sleep apnea (adult) (pediatric): Secondary | ICD-10-CM | POA: Diagnosis not present

## 2017-03-21 DIAGNOSIS — F333 Major depressive disorder, recurrent, severe with psychotic symptoms: Secondary | ICD-10-CM | POA: Diagnosis not present

## 2017-04-19 ENCOUNTER — Encounter: Payer: Self-pay | Admitting: Pulmonary Disease

## 2017-04-19 ENCOUNTER — Ambulatory Visit: Payer: BLUE CROSS/BLUE SHIELD | Admitting: Pulmonary Disease

## 2017-04-19 DIAGNOSIS — Z9989 Dependence on other enabling machines and devices: Secondary | ICD-10-CM

## 2017-04-19 DIAGNOSIS — G4733 Obstructive sleep apnea (adult) (pediatric): Secondary | ICD-10-CM | POA: Diagnosis not present

## 2017-04-19 NOTE — Progress Notes (Signed)
   Subjective:    Patient ID: [name], female    DOB: 01/02/1981, 37 y.o.   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 Home sleep test 03/2017 showed severe OSA with AHI 68/hour  She was started on auto CPAP with full facemask with dramatic improvement in her daytime somnolence and fatigue.  She feels like a new person.  She is able to drive for long hours without falling asleep. She has more energy in the daytime.  Snoring has been abolished. She has adjusted well to the machine and denies dryness, pressure is tolerable. Husband has not complaining.  She is very happy with the results  Review of Systems Patient denies significant dyspnea,cough, hemoptysis,  chest pain, palpitations, pedal edema, orthopnea, paroxysmal nocturnal dyspnea, lightheadedness, nausea, vomiting, abdominal or  leg pains      Objective:   Physical Exam  Gen. Pleasant, well-nourished, in no distress ENT - no thrush, no post nasal drip, class 3 airway Neck: No JVD, no thyromegaly, no carotid bruits Lungs: no use of accessory muscles, no dullness to percussion, clear without rales or rhonchi  Cardiovascular: Rhythm regular, heart sounds  normal, no murmurs or gallops, no peripheral edema Musculoskeletal: No deformities, no cyanosis or clubbing        Assessment & Plan:

## 2017-04-19 NOTE — Assessment & Plan Note (Signed)
CPAP seems to be working well. CPAP supplies will be renewed for a year. We will review report and adjust pressure if needed  Weight loss encouraged, compliance with goal of at least 4-6 hrs every night is the expectation. Advised against medications with sedative side effects Cautioned against driving when sleepy - understanding that sleepiness will vary on a day to day basis

## 2017-04-19 NOTE — Patient Instructions (Signed)
CPAP seems to be working well. CPAP supplies will be renewed for a year. We will review report and adjust pressure if needed

## 2017-04-20 DIAGNOSIS — G4733 Obstructive sleep apnea (adult) (pediatric): Secondary | ICD-10-CM | POA: Diagnosis not present

## 2017-05-15 DIAGNOSIS — Z6826 Body mass index (BMI) 26.0-26.9, adult: Secondary | ICD-10-CM | POA: Diagnosis not present

## 2017-05-15 DIAGNOSIS — Z01419 Encounter for gynecological examination (general) (routine) without abnormal findings: Secondary | ICD-10-CM | POA: Diagnosis not present

## 2017-05-18 DIAGNOSIS — G4733 Obstructive sleep apnea (adult) (pediatric): Secondary | ICD-10-CM | POA: Diagnosis not present

## 2017-06-13 DIAGNOSIS — F333 Major depressive disorder, recurrent, severe with psychotic symptoms: Secondary | ICD-10-CM | POA: Diagnosis not present

## 2017-06-18 DIAGNOSIS — G4733 Obstructive sleep apnea (adult) (pediatric): Secondary | ICD-10-CM | POA: Diagnosis not present

## 2017-07-18 DIAGNOSIS — G4733 Obstructive sleep apnea (adult) (pediatric): Secondary | ICD-10-CM | POA: Diagnosis not present

## 2017-08-18 DIAGNOSIS — G4733 Obstructive sleep apnea (adult) (pediatric): Secondary | ICD-10-CM | POA: Diagnosis not present

## 2017-12-15 ENCOUNTER — Encounter: Payer: BLUE CROSS/BLUE SHIELD | Admitting: Nurse Practitioner

## 2017-12-18 DIAGNOSIS — F333 Major depressive disorder, recurrent, severe with psychotic symptoms: Secondary | ICD-10-CM | POA: Diagnosis not present

## 2017-12-22 ENCOUNTER — Encounter: Payer: BLUE CROSS/BLUE SHIELD | Admitting: Nurse Practitioner

## 2018-01-15 DIAGNOSIS — G4733 Obstructive sleep apnea (adult) (pediatric): Secondary | ICD-10-CM | POA: Diagnosis not present

## 2018-02-22 DIAGNOSIS — H9011 Conductive hearing loss, unilateral, right ear, with unrestricted hearing on the contralateral side: Secondary | ICD-10-CM | POA: Diagnosis not present

## 2018-02-22 DIAGNOSIS — H7191 Unspecified cholesteatoma, right ear: Secondary | ICD-10-CM | POA: Diagnosis not present

## 2018-03-20 DIAGNOSIS — F333 Major depressive disorder, recurrent, severe with psychotic symptoms: Secondary | ICD-10-CM | POA: Diagnosis not present

## 2018-04-06 ENCOUNTER — Encounter: Payer: BLUE CROSS/BLUE SHIELD | Admitting: Nurse Practitioner

## 2018-04-30 DIAGNOSIS — Z713 Dietary counseling and surveillance: Secondary | ICD-10-CM | POA: Diagnosis not present

## 2018-04-30 DIAGNOSIS — Z13220 Encounter for screening for lipoid disorders: Secondary | ICD-10-CM | POA: Diagnosis not present

## 2018-04-30 DIAGNOSIS — Z136 Encounter for screening for cardiovascular disorders: Secondary | ICD-10-CM | POA: Diagnosis not present

## 2018-06-12 DIAGNOSIS — F333 Major depressive disorder, recurrent, severe with psychotic symptoms: Secondary | ICD-10-CM | POA: Diagnosis not present

## 2018-08-06 ENCOUNTER — Encounter: Payer: BLUE CROSS/BLUE SHIELD | Admitting: Nurse Practitioner

## 2018-08-07 DIAGNOSIS — H9011 Conductive hearing loss, unilateral, right ear, with unrestricted hearing on the contralateral side: Secondary | ICD-10-CM | POA: Diagnosis not present

## 2018-08-07 DIAGNOSIS — H7291 Unspecified perforation of tympanic membrane, right ear: Secondary | ICD-10-CM | POA: Diagnosis not present

## 2018-08-07 DIAGNOSIS — H9211 Otorrhea, right ear: Secondary | ICD-10-CM | POA: Diagnosis not present

## 2018-08-07 DIAGNOSIS — H7191 Unspecified cholesteatoma, right ear: Secondary | ICD-10-CM | POA: Diagnosis not present

## 2018-08-24 DIAGNOSIS — Z01419 Encounter for gynecological examination (general) (routine) without abnormal findings: Secondary | ICD-10-CM | POA: Diagnosis not present

## 2018-08-24 DIAGNOSIS — Z124 Encounter for screening for malignant neoplasm of cervix: Secondary | ICD-10-CM | POA: Diagnosis not present

## 2018-08-24 DIAGNOSIS — Z1151 Encounter for screening for human papillomavirus (HPV): Secondary | ICD-10-CM | POA: Diagnosis not present

## 2018-08-24 DIAGNOSIS — Z6824 Body mass index (BMI) 24.0-24.9, adult: Secondary | ICD-10-CM | POA: Diagnosis not present

## 2018-09-04 DIAGNOSIS — F333 Major depressive disorder, recurrent, severe with psychotic symptoms: Secondary | ICD-10-CM | POA: Diagnosis not present

## 2018-09-05 DIAGNOSIS — H7191 Unspecified cholesteatoma, right ear: Secondary | ICD-10-CM | POA: Diagnosis not present

## 2018-09-05 DIAGNOSIS — Z1159 Encounter for screening for other viral diseases: Secondary | ICD-10-CM | POA: Diagnosis not present

## 2018-09-05 DIAGNOSIS — Z01812 Encounter for preprocedural laboratory examination: Secondary | ICD-10-CM | POA: Diagnosis not present

## 2018-09-12 DIAGNOSIS — H6691 Otitis media, unspecified, right ear: Secondary | ICD-10-CM | POA: Diagnosis not present

## 2018-09-12 DIAGNOSIS — H7191 Unspecified cholesteatoma, right ear: Secondary | ICD-10-CM | POA: Diagnosis not present

## 2018-09-12 DIAGNOSIS — M898X8 Other specified disorders of bone, other site: Secondary | ICD-10-CM | POA: Diagnosis not present

## 2018-09-12 DIAGNOSIS — H7291 Unspecified perforation of tympanic membrane, right ear: Secondary | ICD-10-CM | POA: Diagnosis not present

## 2018-09-12 DIAGNOSIS — H7121 Cholesteatoma of mastoid, right ear: Secondary | ICD-10-CM | POA: Diagnosis not present

## 2018-09-12 DIAGNOSIS — H9071 Mixed conductive and sensorineural hearing loss, unilateral, right ear, with unrestricted hearing on the contralateral side: Secondary | ICD-10-CM | POA: Diagnosis not present

## 2018-09-12 DIAGNOSIS — H7101 Cholesteatoma of attic, right ear: Secondary | ICD-10-CM | POA: Diagnosis not present

## 2018-09-12 DIAGNOSIS — H908 Mixed conductive and sensorineural hearing loss, unspecified: Secondary | ICD-10-CM | POA: Diagnosis not present

## 2018-09-13 LAB — HM PAP SMEAR: HM Pap smear: NEGATIVE

## 2018-10-02 ENCOUNTER — Encounter: Payer: Self-pay | Admitting: Family

## 2018-10-02 ENCOUNTER — Other Ambulatory Visit: Payer: Self-pay

## 2018-10-02 ENCOUNTER — Telehealth: Payer: Self-pay | Admitting: Family

## 2018-10-02 ENCOUNTER — Ambulatory Visit: Payer: BC Managed Care – PPO | Admitting: Family

## 2018-10-02 VITALS — BP 95/68 | HR 75 | Temp 98.3°F | Resp 16 | Ht 65.0 in | Wt 141.6 lb

## 2018-10-02 DIAGNOSIS — F419 Anxiety disorder, unspecified: Secondary | ICD-10-CM | POA: Diagnosis not present

## 2018-10-02 DIAGNOSIS — Z0000 Encounter for general adult medical examination without abnormal findings: Secondary | ICD-10-CM | POA: Diagnosis not present

## 2018-10-02 DIAGNOSIS — G4733 Obstructive sleep apnea (adult) (pediatric): Secondary | ICD-10-CM | POA: Diagnosis not present

## 2018-10-02 HISTORY — PX: TYMPANOPLASTY W/ MASTOIDECTOMY: SUR1400

## 2018-10-02 NOTE — Progress Notes (Signed)
Subjective:    Patient ID: Kelly [name], female    DOB: 11/16/1980, 38 y.o.   MRN: [id number]  HPI  Patient is a 38 yr old female who presents today to establish care.    Patient presents today for complete physical.  Immunizations: tetanus 2014 Diet: reports healthy diet  Exercise: walks about 30 minutes a day.  Pap Smear: reports that she just had Pap at [facility] Vision: Due Dental up to date  Wt Readings from Last 3 Encounters:  10/02/18 141 lb 9.6 oz (64.2 kg)  04/19/17 151 lb (68.5 kg)  02/10/17 159 lb (72.1 kg)   OSA- on cpap- followed by Dr. Vassie LollAlva.   Anxiety- maintained on lexapro and latuda. Reports that her symptoms are well controlled and she continues to follow up with psychiatry.   Review of Systems  Constitutional: Negative for unexpected weight change.  HENT: Positive for hearing loss (reports decreased hearing right ear from surgery). Negative for rhinorrhea.   Eyes: Negative for visual disturbance.  Respiratory: Negative for cough.   Cardiovascular: Negative for leg swelling.  Gastrointestinal: Negative for constipation and diarrhea.  Genitourinary: Negative for dysuria, frequency and hematuria.  Musculoskeletal: Negative for arthralgias and myalgias.  Skin: Negative for rash.  Neurological: Negative for headaches (some HA since not wearing cpap due to ear surgery).  Hematological: Negative for adenopathy.  Psychiatric/Behavioral:       Denies depression/anxiety       Past Medical History:  Diagnosis Date  . Medical history non-contributory   . OSA (obstructive sleep apnea) '  . Postpartum care following cesarean delivery 06/09/2012  . S/P repeat low transverse C-section 06/09/2012  . S/P tubal ligation 06/09/2012     Social History   Socioeconomic History  . Marital status: Married    Spouse name: Not on file  . Number of children: 3  . Years of education: Not on file  . Highest education level: Not on file  Occupational History  .  Occupation: Education administratoranalyst     Employer: [employer]  Social Needs  . Financial resource strain: Not hard at all  . Food insecurity    Worry: Never true    Inability: Never true  . Transportation needs    Medical: No    Non-medical: No  Tobacco Use  . Smoking status: Never Smoker  . Smokeless tobacco: Never Used  Substance and Sexual Activity  . Alcohol use: Yes    Comment: Rarely   . Drug use: No  . Sexual activity: Yes    Partners: Male  Lifestyle  . Physical activity    Days per week: 3 days    Minutes per session: 30 min  . Stress: Not on file  Relationships  . Social connections    Talks on phone: More than three times a week    Gets together: Once a week    Attends religious service: More than 4 times per year    Active member of club or organization: No    Attends meetings of clubs or organizations: Never    Relationship status: Married  . Intimate partner violence    Fear of current or ex partner: No    Emotionally abused: No    Physically abused: No    Forced sexual activity: No  Other Topics Concern  . Not on file  Social History Narrative   Has lived locally since 2013   3 children (twin girls borth 2014, 1 son 2006)   Works as a  Conservation officer, historic buildingssecurity analyst at [employer]   Enjoys spending time with family pets (one dog, 2 turtles, fish, kids)   Married   Mother-in-law lives with them   She has a Scientist, [employer] (MBA in Education officer, environmentalinance)   [place] up [place] moved here at age 38 for school   (sister and mom live in [place])     Past Surgical History:  Procedure Laterality Date  . CESAREAN SECTION  2006  . CESAREAN SECTION N/A 06/08/2012   Procedure: CESAREAN SECTION;  Surgeon: Robley FriesVaishali R [name], MD;  Location: [facility];  Service: Obstetrics;  Laterality: N/A;  Repeat C/S  EDD: 06/24/12;Twins Monochorionic diamniotic  . TYMPANOPLASTY  2010    Family History  Problem Relation Age of Onset  . Pneumonia Father 5975       died from pneumonia complications  . Spondylitis Mother     Allergies   Allergen Reactions  . Nubain [Nalbuphine] Itching  . Scopolamine Itching  . Venlafaxine Hives    Heart palpitations, headache, burning skin, shakiness, dry mouth, urinary issues,  . Latex Itching and Rash    Current Outpatient Medications on File Prior to Visit  Medication Sig Dispense Refill  . escitalopram (LEXAPRO) 20 MG tablet Take 20 mg by mouth daily.   0  . LATUDA 40 MG TABS tablet Take 1 tablet by mouth daily.  1   No current facility-administered medications on file prior to visit.     BP 95/68 (BP Location: Right Arm, Patient Position: Sitting, Cuff Size: Small)   Pulse 75   Temp 98.3 F (36.8 C) (Oral)   Resp 16   Ht 5\' 5"  (1.651 m)   Wt 141 lb 9.6 oz (64.2 kg)   SpO2 99%   BMI 23.56 kg/m    Objective:   Physical Exam  Physical Exam  Constitutional: She is oriented to person, place, and time. She appears well-developed and well-nourished. No distress.  HENT:  Head: Normocephalic and atraumatic.  Right Ear: Tympanic membrane is dull, no erythema Left Ear: Tympanic membrane and ear canal normal.  Mouth/Throat: Not examined- pt is wearing mask for covid-19 precautions Eyes: Pupils are equal, round, and reactive to light. No scleral icterus.  Neck: Normal range of motion. No thyromegaly present.  Cardiovascular: Normal rate and regular rhythm.   No murmur heard. Pulmonary/Chest: Effort normal and breath sounds normal. No respiratory distress. He has no wheezes. She has no rales. She exhibits no tenderness.  Abdominal: Soft. Bowel sounds are normal. She exhibits no distension and no mass. There is no tenderness. There is no rebound and no guarding.  Musculoskeletal: She exhibits no edema.  Lymphadenopathy:    She has no cervical adenopathy.  Neurological: She is alert and oriented to person, place, and time. She has normal patellar reflexes. She exhibits normal muscle tone. Coordination normal.  Skin: Skin is warm and dry.  Psychiatric: She has a normal mood  and affect. Her behavior is normal. Judgment and thought content normal.  Breast/pelvic: deferred        Assessment & Plan:   Preventative care- I commended her on her weight loss efforts and healthy diet.  Immunizations reviewed and up to date. Pap and mammo up to date. Will request records.  Obtain routine lab work.   OSA- generally well controlled on CPAP, however she is not able to use for next 6 weeks per ENT following her right ear surgery. Finding herself more tired. She has lost a good amount of weight however. Management per  Dr. Elsworth Soho.  Anxiety- stable on current meds. Management per psychiatry.        Assessment & Plan:

## 2018-10-02 NOTE — Telephone Encounter (Signed)
Could you please call [facility] to request a copy of her pap?

## 2018-10-02 NOTE — Patient Instructions (Addendum)
Please complete lab work prior to leaving. Continue your work on [employer], exercise.

## 2018-10-02 NOTE — Telephone Encounter (Signed)
Medical records request form faxed to [facility]

## 2018-10-03 LAB — BASIC METABOLIC PANEL
BUN: 15 mg/dL (ref 6–23)
CO2: 28 mEq/L (ref 19–32)
Calcium: 9.3 mg/dL (ref 8.4–10.5)
Chloride: 102 mEq/L (ref 96–112)
Creatinine, Ser: 0.95 mg/dL (ref 0.40–1.20)
GFR: 65.3 mL/min (ref >60.00)
Glucose, Bld: 89 mg/dL (ref 70–99)
Potassium: 4.2 mEq/L (ref 3.5–5.1)
Sodium: 137 mEq/L (ref 135–145)

## 2018-10-03 LAB — HEPATIC FUNCTION PANEL
ALT: 10 U/L (ref 0–35)
AST: 16 U/L (ref 0–37)
Albumin: 4.3 g/dL (ref 3.5–5.2)
Alkaline Phosphatase: 67 U/L (ref 39–117)
Bilirubin, Direct: 0 mg/dL (ref 0.0–0.3)
Total Bilirubin: 0.3 mg/dL (ref 0.2–1.2)
Total Protein: 7.1 g/dL (ref 6.0–8.3)

## 2018-10-03 LAB — LIPID PANEL
Cholesterol: 148 mg/dL (ref 0–200)
HDL: 40.4 mg/dL (ref >39.00)
LDL Cholesterol: 84 mg/dL (ref 0–99)
NonHDL: 107.67
Total CHOL/HDL Ratio: 4
Triglycerides: 117 mg/dL (ref 0.0–149.0)
VLDL: 23.4 mg/dL (ref 0.0–40.0)

## 2018-10-03 LAB — CBC WITH DIFFERENTIAL/PLATELET
Basophils Absolute: 0.1 10*3/uL (ref 0.0–0.1)
Basophils Relative: 0.7 % (ref 0.0–3.0)
Eosinophils Absolute: 0.1 10*3/uL (ref 0.0–0.7)
Eosinophils Relative: 1.2 % (ref 0.0–5.0)
HCT: 36 % (ref 36.0–46.0)
Hemoglobin: 11.4 g/dL — ABNORMAL LOW (ref 12.0–15.0)
Lymphocytes Relative: 33.1 % (ref 12.0–46.0)
Lymphs Abs: 2.7 10*3/uL (ref 0.7–4.0)
MCHC: 31.7 g/dL (ref 30.0–36.0)
MCV: 81.8 fl (ref 78.0–100.0)
Monocytes Absolute: 0.4 10*3/uL (ref 0.1–1.0)
Monocytes Relative: 5.2 % (ref 3.0–12.0)
Neutro Abs: 4.9 10*3/uL (ref 1.4–7.7)
Neutrophils Relative %: 59.8 % (ref 43.0–77.0)
Platelets: 223 10*3/uL (ref 150.0–400.0)
RBC: 4.4 Mil/uL (ref 3.87–5.11)
RDW: 15.4 % (ref 11.5–15.5)
WBC: 8.3 10*3/uL (ref 4.0–10.5)

## 2018-10-03 LAB — TSH: TSH: 0.79 u[IU]/mL (ref 0.35–4.50)

## 2018-10-04 ENCOUNTER — Encounter: Payer: Self-pay | Admitting: Family

## 2018-10-04 ENCOUNTER — Other Ambulatory Visit: Payer: Self-pay | Admitting: Family

## 2018-10-04 MED ORDER — MULTI-VITAMIN/MINERALS PO TABS: 1.0000 | ORAL_TABLET | Freq: Every day | ORAL | Status: DC

## 2018-10-29 ENCOUNTER — Encounter: Payer: Self-pay | Admitting: Nurse Practitioner

## 2018-11-27 DIAGNOSIS — F333 Major depressive disorder, recurrent, severe with psychotic symptoms: Secondary | ICD-10-CM | POA: Diagnosis not present

## 2018-12-13 DIAGNOSIS — Z48810 Encounter for surgical aftercare following surgery on the sense organs: Secondary | ICD-10-CM | POA: Diagnosis not present

## 2018-12-13 DIAGNOSIS — Z9889 Other specified postprocedural states: Secondary | ICD-10-CM | POA: Diagnosis not present

## 2018-12-13 DIAGNOSIS — H9011 Conductive hearing loss, unilateral, right ear, with unrestricted hearing on the contralateral side: Secondary | ICD-10-CM | POA: Diagnosis not present

## 2018-12-13 DIAGNOSIS — H7191 Unspecified cholesteatoma, right ear: Secondary | ICD-10-CM | POA: Diagnosis not present

## 2019-02-18 DIAGNOSIS — F333 Major depressive disorder, recurrent, severe with psychotic symptoms: Secondary | ICD-10-CM | POA: Diagnosis not present

## 2019-03-15 DIAGNOSIS — G4733 Obstructive sleep apnea (adult) (pediatric): Secondary | ICD-10-CM | POA: Diagnosis not present

## 2019-04-08 DIAGNOSIS — H9071 Mixed conductive and sensorineural hearing loss, unilateral, right ear, with unrestricted hearing on the contralateral side: Secondary | ICD-10-CM | POA: Diagnosis not present

## 2019-04-08 DIAGNOSIS — H7191 Unspecified cholesteatoma, right ear: Secondary | ICD-10-CM | POA: Diagnosis not present

## 2019-05-13 ENCOUNTER — Encounter: Payer: Self-pay | Admitting: Family

## 2019-05-13 ENCOUNTER — Ambulatory Visit (INDEPENDENT_AMBULATORY_CARE_PROVIDER_SITE_OTHER): Payer: BC Managed Care – PPO | Admitting: Family

## 2019-05-13 DIAGNOSIS — F333 Major depressive disorder, recurrent, severe with psychotic symptoms: Secondary | ICD-10-CM | POA: Diagnosis not present

## 2019-05-13 DIAGNOSIS — L819 Disorder of pigmentation, unspecified: Secondary | ICD-10-CM | POA: Diagnosis not present

## 2019-05-13 NOTE — Progress Notes (Signed)
Virtual Visit via Video Note  I connected with [name] on 05/13/19 at 10:20 AM EST by a video enabled telemedicine application and verified that I am speaking with the correct person using two identifiers.  Location: Patient: home Provider: home   I discussed the limitations of evaluation and management by telemedicine and the availability of in person appointments. The patient expressed understanding and agreed to proceed.  History of Present Illness:  Patient is a 39yr old female who presents today with chief complaint of dark spots on her cheeks and around her lips.  Past Medical History:  Diagnosis Date  . Anxiety   . Medical history non-contributory   . OSA (obstructive sleep apnea) '  . Postpartum care following cesarean delivery 06/09/2012  . S/P repeat low transverse C-section 06/09/2012  . S/P tubal ligation 06/09/2012     Social History   Socioeconomic History  . Marital status: Married    Spouse name: Not on file  . Number of children: 3  . Years of education: Not on file  . Highest education level: Not on file  Occupational History  . Occupation: Press photographer: [employer]  Tobacco Use  . Smoking status: Never Smoker  . Smokeless tobacco: Never Used  Substance and Sexual Activity  . Alcohol use: Yes    Comment: Rarely   . Drug use: No  . Sexual activity: Yes    Partners: Male  Other Topics Concern  . Not on file  Social History Narrative   Has lived locally since 2013   3 children (twin girls borth 2014, 1 son 2006)   Works as a Biomedical engineer at [employer]   Enjoys spending time with family pets (one dog, 2 turtles, fish, kids)   Married   Mother-in-law lives with them   She has a Oceanographer (MBA in Engineer, mining)   [place] up in [place] moved here at age 23 for school   (sister and mom live in [place])    Social Determinants of Health   Financial Resource Strain: [place]   . Difficulty of Paying Living Expenses: Not hard at all  Food Insecurity:  No Food Insecurity  . Worried About Charity fundraiser in the Last Year: Never true  . Ran Out of Food in the Last Year: Never true  Transportation Needs: No Transportation Needs  . Lack of Transportation (Medical): No  . Lack of Transportation (Non-Medical): No  Physical Activity: Insufficiently Active  . Days of Exercise per Week: 3 days  . Minutes of Exercise per Session: 30 min  Stress:   . Feeling of Stress : Not on file  Social Connections: Slightly Isolated  . Frequency of Communication with Friends and Family: More than three times a week  . Frequency of Social Gatherings with Friends and Family: Once a week  . Attends Religious Services: More than 4 times per year  . Active Member of Clubs or Organizations: No  . Attends Archivist Meetings: Never  . Marital Status: Married  Human resources officer Violence: Not At Risk  . Fear of Current or Ex-Partner: No  . Emotionally Abused: No  . Physically Abused: No  . Sexually Abused: No    Past Surgical History:  Procedure Laterality Date  . CESAREAN SECTION  2006  . CESAREAN SECTION N/A 06/08/2012   Procedure: CESAREAN SECTION;  Surgeon: Elveria Royals, MD;  Location: [facility];  Service: Obstetrics;  Laterality: N/A;  Repeat C/S  EDD:  06/24/12;Twins Monochorionic diamniotic  . TYMPANOPLASTY  2010  . TYMPANOPLASTY W/ MASTOIDECTOMY Right 10/02/2018    Family History  Problem Relation Age of Onset  . Pneumonia Father 96       died from pneumonia complications  . Spondylitis Mother     Allergies  Allergen Reactions  . Nubain [Nalbuphine] Itching  . Scopolamine Itching  . Venlafaxine Hives    Heart palpitations, headache, burning skin, shakiness, dry mouth, urinary issues,  . Latex Itching and Rash    Current Outpatient Medications on File Prior to Visit  Medication Sig Dispense Refill  . escitalopram (LEXAPRO) 20 MG tablet Take 20 mg by mouth daily.   0  . LATUDA 40 MG TABS tablet Take 1 tablet by mouth daily.  1   . Multiple Vitamins-Minerals (MULTIVITAMIN WITH MINERALS) tablet Take 1 tablet by mouth daily.     No current facility-administered medications on file prior to visit.    There were no vitals taken for this visit.     Observations/Objective:   Gen: Awake, alert, no acute distress Resp: Breathing is even and non-labored Psych: calm/pleasant demeanor Neuro: Alert and Oriented x 3, + facial symmetry, speech is clear. Skin: a few scattered dark spots noted on bilateral cheeks L>R. Some darkening of skin around the left upper/lower lateral lip.   Assessment and Plan:  Skin discoloration of face- she is not currently on any OCP's.  Notes that she first noticed these changes following her second pregnancy. Could represent some mild melasma.  We discussed importance of sunscreen use.  Will also refer to dermatology at pt request for further evaluation.  Follow Up Instructions:    I discussed the assessment and treatment plan with the patient. The patient was provided an opportunity to ask questions and all were answered. The patient agreed with the plan and demonstrated an understanding of the instructions.   The patient was advised to call back or seek an in-person evaluation if the symptoms worsen or if the condition fails to improve as anticipated.  Lemont Fillers, NP

## 2019-05-16 ENCOUNTER — Ambulatory Visit: Payer: BC Managed Care – PPO | Attending: Internal Medicine

## 2019-05-16 DIAGNOSIS — Z23 Encounter for immunization: Secondary | ICD-10-CM

## 2019-05-16 NOTE — Progress Notes (Signed)
   Covid-19 Vaccination Clinic  Name:  Darleny Sem    MRN: [id number] DOB: 28-Dec-1980  05/16/2019  Ms. Murdy was observed post Covid-19 immunization for 15 minutes without incident. She was provided with Vaccine Information Sheet and instruction to access the V-Safe system.   Ms. Blakney was instructed to call 911 with any severe reactions post vaccine: Marland Kitchen Difficulty breathing  . Swelling of face and throat  . A fast heartbeat  . A bad rash all over body  . Dizziness and weakness   Immunizations Administered    Name Date Dose VIS Date Route   Pfizer COVID-19 Vaccine 05/16/2019  1:44 PM 0.3 mL 02/15/2019 Intramuscular   Manufacturer: [employer], [employer]   Lot: [id number]   NDC: [address]-3

## 2019-06-12 ENCOUNTER — Ambulatory Visit: Payer: BC Managed Care – PPO | Attending: Internal Medicine

## 2019-06-12 DIAGNOSIS — Z23 Encounter for immunization: Secondary | ICD-10-CM

## 2019-06-12 NOTE — Progress Notes (Signed)
   Covid-19 Vaccination Clinic  Name:  Corianna Avallone    MRN: [id number] DOB: 03-01-1981  06/12/2019  Ms. Moffatt was observed post Covid-19 immunization for 15 minutes without incident. She was provided with Vaccine Information Sheet and instruction to access the V-Safe system.   Ms. Steelman was instructed to call 911 with any severe reactions post vaccine: Marland Kitchen Difficulty breathing  . Swelling of face and throat  . A fast heartbeat  . A bad rash all over body  . Dizziness and weakness   Immunizations Administered    Name Date Dose VIS Date Route   Pfizer COVID-19 Vaccine 06/12/2019  1:21 PM 0.3 mL 02/15/2019 Intramuscular   Manufacturer: [employer], [employer]   Lot: [id number]   NDC: [address]-8

## 2019-07-09 ENCOUNTER — Encounter: Payer: Self-pay | Admitting: Dermatology

## 2019-07-09 ENCOUNTER — Other Ambulatory Visit: Payer: Self-pay

## 2019-07-09 ENCOUNTER — Ambulatory Visit: Payer: BC Managed Care – PPO | Admitting: Dermatology

## 2019-07-09 ENCOUNTER — Telehealth: Payer: Self-pay | Admitting: Dermatology

## 2019-07-09 DIAGNOSIS — L811 Chloasma: Secondary | ICD-10-CM

## 2019-07-09 DIAGNOSIS — Z840 Family history of diseases of the skin and subcutaneous tissue: Secondary | ICD-10-CM | POA: Diagnosis not present

## 2019-07-09 DIAGNOSIS — L700 Acne vulgaris: Secondary | ICD-10-CM | POA: Diagnosis not present

## 2019-07-09 MED ORDER — TRI-LUMA 0.01-4-0.05 % EX CREA: 1.0000 | TOPICAL_CREAM | Freq: Every day | CUTANEOUS | 1 refills | Status: AC

## 2019-07-09 NOTE — Progress Notes (Signed)
   New Patient   Subjective  [name] is a 39 y.o. female who presents for the following: Skin Problem (isk ? on facial cheek for years, sister has history of melasma).  Pigmentation Location: Mostly on face Duration: Several years Quality: Persistent Associated Signs/Symptoms: Modifying Factors: Over-the-counter products Severity:  Timing: Context:    The following portions of the chart were reviewed this encounter and updated as appropriate: Tobacco  Allergies  Meds  Problems  Med Hx  Surg Hx  Fam Hx      Objective  Well appearing patient in no apparent distress; mood and affect are within normal limits.  A focused examination was performed including Head and neck. Relevant physical exam findings are noted in the Assessment and Plan.   Assessment & Plan  Acne vulgaris (2) Left Buccal Cheek ; Right Buccal Cheek   Other Related Medications tretinoin (RETIN-A) 0.1 % cream  Melasma (2) Left Malar Cheek; Right Malar Cheek  Tri-Luma We discussed three pigmentary issues on [name]'s his face.  There are 2 small solar lentigo freckles on her left cheek.  There is a patchy pigmentation above the lip and on the cheeks more than on the forehead: Melasma.  There is some postinflammatory pigmentation from old acne.  I encouraged [name] to search for a #50+ SPF sunblock that does not aggravate her acne.  She may try sunblock for oily skin or active for sport.  Prescription provided for Triluma; she will check the insurance coverage and cost; call if there is an issue.  This is applied daily for a minimal of 10 weeks and works somewhat better if used in the late fall and winter.  If there is irritation, she may use the medication less often.  Should this fail we also discussed the custom-made [place] formula which I generally prefer to use in the winter.  Initial follow-up by MyChart or telephone in 10 weeks

## 2019-07-09 NOTE — Telephone Encounter (Signed)
Patient returned to office after checking out to report that insurance does not cover the medicine Dr. Jorja Loa prescribed. Patient is asking if there anything else he can prescribe to her pharmacy-[facility]?

## 2019-07-10 MED ORDER — TRETINOIN 0.1 % EX CREA: TOPICAL_CREAM | Freq: Every evening | CUTANEOUS | 0 refills | Status: DC

## 2019-07-10 NOTE — Telephone Encounter (Signed)
Patient calling for update on previous request of prescription being changed; patient has not heard from our office.

## 2019-07-10 NOTE — Telephone Encounter (Signed)
Dr. Karie Schwalbe had informed her at visit Rx would not be covered. Option is non-rx Nadinola plus (3% hydroquinone) apply first hs, wait 2-3 min and apply Rx. 0.1 tretinoin cream (which also is likely NOT covered)

## 2019-07-15 ENCOUNTER — Encounter: Payer: Self-pay | Admitting: Dermatology

## 2019-08-07 DIAGNOSIS — F333 Major depressive disorder, recurrent, severe with psychotic symptoms: Secondary | ICD-10-CM | POA: Diagnosis not present

## 2019-08-14 ENCOUNTER — Other Ambulatory Visit: Payer: Self-pay

## 2019-08-14 ENCOUNTER — Ambulatory Visit: Payer: BC Managed Care – PPO | Admitting: Family

## 2019-08-14 ENCOUNTER — Encounter: Payer: Self-pay | Admitting: Family

## 2019-08-14 VITALS — BP 105/67 | HR 63 | Temp 96.9°F | Resp 16 | Ht 65.0 in | Wt 152.0 lb

## 2019-08-14 DIAGNOSIS — R635 Abnormal weight gain: Secondary | ICD-10-CM | POA: Diagnosis not present

## 2019-08-14 DIAGNOSIS — R5383 Other fatigue: Secondary | ICD-10-CM | POA: Diagnosis not present

## 2019-08-14 DIAGNOSIS — G4733 Obstructive sleep apnea (adult) (pediatric): Secondary | ICD-10-CM | POA: Diagnosis not present

## 2019-08-14 NOTE — Progress Notes (Signed)
Subjective:    Patient ID: [name], female    DOB: Apr 14, 1980, 39 y.o.   MRN: [id number]  HPI patient is a 39 year old female who presents today with chief complaint of weight gain and fatigue.  She reports that she has been compliant with her CPAP.  However she will often wake up so sleepy that it is hard to open her eyes.  Throughout the day she can fall asleep quite easily.  She notes that she has been less hungry lately.  The fatigue began about 1 month ago.  She has gained approximately 12 pounds in the last 3 months.  Review of Systems See HPI  Past Medical History:  Diagnosis Date  . Anxiety   . Medical history non-contributory   . OSA (obstructive sleep apnea) '  . Postpartum care following cesarean delivery 06/09/2012  . S/P repeat low transverse C-section 06/09/2012  . S/P tubal ligation 06/09/2012     Social History   Socioeconomic History  . Marital status: Married    Spouse name: Not on file  . Number of children: 3  . Years of education: Not on file  . Highest education level: Not on file  Occupational History  . Occupation: Press photographer: [employer]  Tobacco Use  . Smoking status: Never Smoker  . Smokeless tobacco: Never Used  Substance and Sexual Activity  . Alcohol use: Yes    Comment: Rarely   . Drug use: No  . Sexual activity: Yes    Partners: Male  Other Topics Concern  . 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 Difficulty of Paying Living Expenses: Not hard at all  Food Insecurity: No Food Insecurity  .  Worried About Charity fundraiser in the Last Year: Never true  . Ran Out of Food in the Last Year: Never true  Transportation Needs: No Transportation Needs  . Lack of Transportation (Medical): No  . Lack of Transportation (Non-Medical): No  Physical Activity: Insufficiently Active  . Days of Exercise per Week: 3 days  . Minutes of Exercise per Session: 30 min  Stress:   . Feeling of Stress :   Social Connections: Slightly Isolated  . Frequency of Communication with Friends and Family: More than three times a week  . Frequency of Social Gatherings with Friends and Family: Once a week  . Attends Religious Services: More than 4 times per year  . Active Member of Clubs or Organizations: No  . Attends Archivist Meetings: Never  . Marital Status: Married  Human resources officer Violence: Not At Risk  . Fear of Current or Ex-Partner: No  . Emotionally Abused: No  . Physically Abused: No  . Sexually Abused: No    Past Surgical History:  Procedure Laterality Date  . CESAREAN SECTION  2006  . CESAREAN SECTION N/A 06/08/2012   Procedure: CESAREAN SECTION;  Surgeon: Elveria Royals, MD;  Location: [facility];  Service: Obstetrics;  Laterality: N/A;  Repeat C/S  EDD: 06/24/12;Twins Monochorionic diamniotic  . TYMPANOPLASTY  2010  . TYMPANOPLASTY W/ MASTOIDECTOMY Right 10/02/2018    Family History  Problem Relation Age of Onset  . Pneumonia Father 27       died from pneumonia complications  . Spondylitis Mother     Allergies  Allergen Reactions  . Nubain [Nalbuphine] Itching  . Scopolamine Itching  . Venlafaxine Hives    Heart palpitations, headache, burning skin, shakiness, dry mouth, urinary issues,  . Latex Itching and Rash    Current Outpatient Medications on File Prior to Visit  Medication Sig Dispense Refill  . escitalopram (LEXAPRO) 20 MG tablet Take 20 mg by mouth daily.   0  . LATUDA 40 MG TABS tablet Take 1 tablet by mouth daily.  1  . Multiple Vitamins-Minerals  (MULTIVITAMIN WITH MINERALS) tablet Take 1 tablet by mouth daily. (Patient not taking: Reported on 07/09/2019)    . tretinoin (RETIN-A) 0.1 % cream Apply topically at bedtime. 45 g 0   No current facility-administered medications on file prior to visit.    BP 105/67 (BP Location: Right Arm, Patient Position: Sitting, Cuff Size: Small)   Pulse 63   Temp (!) 96.9 F (36.1 C) (Temporal)   Resp 16   Ht 5\' 5"  (1.651 m)   Wt 152 lb (68.9 kg)   SpO2 100%   BMI 25.29 kg/m       Objective:   Physical Exam Constitutional:      Appearance: She is well-developed.  Neck:     Thyroid: No thyroid mass or thyromegaly.     Comments: Increased adipose tissue noted on neck Cardiovascular:     Rate and Rhythm: Normal rate and regular rhythm.     Heart sounds: Normal heart sounds. No murmur.  Pulmonary:     Effort: Pulmonary effort is normal. No respiratory distress.     Breath sounds: Normal breath sounds. No wheezing.  Psychiatric:        Behavior: Behavior normal.        Thought Content: Thought content normal.        Judgment: Judgment normal.           Assessment & Plan:  Fatigue/weight gain- check cbc, TSH.  Wt Readings from Last 3 Encounters:  08/14/19 152 lb (68.9 kg)  10/02/18 141 lb 9.6 oz (64.2 kg)  04/19/17 151 lb (68.5 kg)     OSA- hx of severe OSA. ? If she may need adjustment of her cpap settings. Reports good compliance with cpap. Will refer back to Dr. 04/21/17 for ongoing management of her CPAP.    This visit occurred during the SARS-CoV-2 public health emergency.  Safety protocols were in place, including screening questions prior to the visit, additional usage of staff PPE, and extensive cleaning of exam room while observing appropriate contact time as indicated for disinfecting solutions.

## 2019-08-14 NOTE — Patient Instructions (Signed)
You should be contacted about scheduling your appointment with Dr. Vassie Loll.

## 2019-08-15 ENCOUNTER — Other Ambulatory Visit (INDEPENDENT_AMBULATORY_CARE_PROVIDER_SITE_OTHER): Payer: BC Managed Care – PPO

## 2019-08-15 DIAGNOSIS — D649 Anemia, unspecified: Secondary | ICD-10-CM | POA: Diagnosis not present

## 2019-08-15 LAB — CBC WITH DIFFERENTIAL/PLATELET
Basophils Absolute: 0.1 10*3/uL (ref 0.0–0.1)
Basophils Relative: 1.1 % (ref 0.0–3.0)
Eosinophils Absolute: 0.1 10*3/uL (ref 0.0–0.7)
Eosinophils Relative: 0.8 % (ref 0.0–5.0)
HCT: 34 % — ABNORMAL LOW (ref 36.0–46.0)
Hemoglobin: 10.9 g/dL — ABNORMAL LOW (ref 12.0–15.0)
Lymphocytes Relative: 31 % (ref 12.0–46.0)
Lymphs Abs: 2.6 10*3/uL (ref 0.7–4.0)
MCHC: 32 g/dL (ref 30.0–36.0)
MCV: 80.9 fl (ref 78.0–100.0)
Monocytes Absolute: 0.5 10*3/uL (ref 0.1–1.0)
Monocytes Relative: 5.4 % (ref 3.0–12.0)
Neutro Abs: 5.2 10*3/uL (ref 1.4–7.7)
Neutrophils Relative %: 61.7 % (ref 43.0–77.0)
Platelets: 216 10*3/uL (ref 150.0–400.0)
RBC: 4.2 Mil/uL (ref 3.87–5.11)
RDW: 15.7 % — ABNORMAL HIGH (ref 11.5–15.5)
WBC: 8.4 10*3/uL (ref 4.0–10.5)

## 2019-08-15 LAB — TSH: TSH: 1.12 u[IU]/mL (ref 0.35–4.50)

## 2019-08-16 LAB — IBC + FERRITIN
Ferritin: 4.2 ng/mL — ABNORMAL LOW (ref 10.0–291.0)
Iron: 50 ug/dL (ref 42–145)
Saturation Ratios: 10.2 % — ABNORMAL LOW (ref 20.0–50.0)
Transferrin: 349 mg/dL (ref 212.0–360.0)

## 2019-08-18 ENCOUNTER — Telehealth: Payer: Self-pay | Admitting: Family

## 2019-08-18 DIAGNOSIS — E611 Iron deficiency: Secondary | ICD-10-CM

## 2019-08-18 MED ORDER — FERROUS SULFATE 325 (65 FE) MG PO TABS: 325.0000 mg | ORAL_TABLET | Freq: Every day | ORAL | 3 refills | Status: DC

## 2019-08-18 NOTE — Telephone Encounter (Signed)
Please advise pt that her iron is low.  I would like her to add otc iron 325mg  once daily.  Repeat lab work in 3 months.   Thyroid is normal.

## 2019-08-19 NOTE — Telephone Encounter (Signed)
Patient advised of results and new supplements. She will get this otc.

## 2019-08-23 ENCOUNTER — Encounter: Payer: Self-pay | Admitting: Acute Care

## 2019-08-23 ENCOUNTER — Other Ambulatory Visit: Payer: Self-pay

## 2019-08-23 ENCOUNTER — Telehealth: Payer: Self-pay | Admitting: Pulmonary Disease

## 2019-08-23 ENCOUNTER — Telehealth: Payer: Self-pay | Admitting: Acute Care

## 2019-08-23 ENCOUNTER — Ambulatory Visit: Payer: BC Managed Care – PPO | Admitting: Acute Care

## 2019-08-23 VITALS — BP 118/74 | HR 70 | Temp 98.4°F | Ht 65.0 in | Wt 153.0 lb

## 2019-08-23 DIAGNOSIS — G4733 Obstructive sleep apnea (adult) (pediatric): Secondary | ICD-10-CM | POA: Diagnosis not present

## 2019-08-23 DIAGNOSIS — Z9989 Dependence on other enabling machines and devices: Secondary | ICD-10-CM

## 2019-08-23 DIAGNOSIS — R635 Abnormal weight gain: Secondary | ICD-10-CM

## 2019-08-23 NOTE — Telephone Encounter (Signed)
Spoke with patient. She verbalized understanding. I advised her that once the order is placed, the PCCs will contact her insurance to make sure they will pay for it.   She wants to know how much the bipap titration would cost if insurance was not involved.   PCCs, can you all help with this? Thanks!

## 2019-08-23 NOTE — Patient Instructions (Addendum)
It is nice to meet you today. We will add you to [employer] and get a down Loads from your machine.  We will schedule you for a BiPAP titration as, I do not believe your CPAP is adequate to control your sleep apnea.  This should allow [place] to better treat your OSA.  We will call you with the results of the titration study, and make any necessary changes.  Then follow up with Dr. Vassie Loll 7/21 as is scheduled for down Load at that time. Please remember to follow up as this is an important part of ensuring your CPAP/ BiPAP  therapy is working.  Please contact office for sooner follow up if symptoms do not improve or worsen or seek emergency care

## 2019-08-23 NOTE — Telephone Encounter (Signed)
Please call patient and let her know her down Load indicates that the CPAP may not be adequate to control her apnea. Let her know we will set her up to do a BiPAP titration study, to see if BiPAP may be a better choice to control her OSA. Marland Kitchen Please place order for BiPAP titration study.  Thanks so much

## 2019-08-23 NOTE — Progress Notes (Signed)
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 Home sleep test 03/2017 showed severe OSA with AHI 68/hour with OSA. She has followed up once since  she got her machine, but not since. We have no down Loads on file... Last appointment was 04/2017. She is followed by Dr. Vassie Fernandez.    08/23/2019  Follow up CPAP. Pt. Presents for follow up. She was last seen in the office 04/2017. She  followed up x 1 after starting her CPAP therapy. We have never had a down Load from her device. She states that it initially worked very well, but over the last 6 months she has noted she has daytime sleepiness, lack of appetite and weight gain. She states she also has very light periods. This is a change. She has not followed up with gynecology. She denies any morning headaches upon awakening. He has been using her CPAP every night for about 7 hours a night. She feels her machine is no longer working.She does not experience the relief she did upon initiation of therapy.She has been getting new supplies throughout this interval. She feels she has good mask fit and no leak.  As the patient was not enrolled in [employer], there was a delay in getting data from the patient's machine. Down Load is as noted below.  Patient is compliant with CPAP.  She states she has had some weight gain.  Test Results: Home sleep test 03/2017 showed severe OSA with AHI 68/hour with OSA.  CPAP Down Load 5/19-6/17 2021 Days used 26 of 30 days or 87% Usage days greater than 4 hours 24 days or 80%  Usage days less than 4 hours to days or 7% Average usage total days 5 hours 57 minutes Air sense 10 AutoSet AutoSet mode 4 cm H2O - 20 cm H2O Median pressure 17 cm H2O Maximum pressure 19.9 cm H2O Maximum leaks 2.2 AHI 14.4 Central apnea 0.6 Obstructive apnea 11.1 Unknown apneas 0.1     CBC Latest Ref Rng & Units 08/14/2019 10/02/2018 08/17/2015  WBC 4.0 - 10.5 K/uL 8.4 8.3 5.9    Hemoglobin 12.0 - 15.0 g/dL 10.9(L) 11.4(L) 11.8  Hematocrit 36 - 46 % 34.0(L) 36.0 36.8  Platelets 150 - 400 K/uL 216.0 223.0 217    BMP Latest Ref Rng & Units 10/02/2018 08/17/2015 12/27/2014  Glucose 70 - 99 mg/dL 89 82 92  BUN 6 - 23 mg/dL 15 10 13   Creatinine 0.40 - 1.20 mg/dL 3.29 9.24  BUN/Creat Ratio 9 - 23 - 15 -  Sodium 135 - 145 mEq/L 137 137 137  Potassium 3.5 - 5.1 mEq/L 4.2 4.4 3.9  Chloride 96 - 112 mEq/L 102 99 112(H)  CO2 19 - 32 mEq/L 28 24 18(L)  Calcium 8.4 - 10.5 mg/dL 9.3 8.7 2.68)    BNP No results found for: BNP  ProBNP No results found for: PROBNP  PFT No results found for: FEV1PRE, FEV1POST, FVCPRE, FVCPOST, TLC, DLCOUNC, PREFEV1FVCRT, PSTFEV1FVCRT  No results found.   Past medical hx Past Medical History:  Diagnosis Date  . Anxiety   . Medical history non-contributory   . OSA (obstructive sleep apnea) '  . Postpartum care following cesarean delivery 06/09/2012  . S/P repeat low transverse C-section 06/09/2012  . S/P tubal ligation 06/09/2012     Social History   Tobacco Use  . Smoking status: Never Smoker  . Smokeless tobacco: Never Used  Vaping Use  .  Vaping Use: Never used  Substance Use Topics  . Alcohol use: Yes    Comment: Rarely   . Drug use: No    Ms.[name] reports that she has never smoked. She has never used smokeless tobacco. She reports current alcohol use. She reports that she does not use drugs.  Tobacco Cessation: Never smoker  Past surgical hx, Family hx, Social hx all reviewed.  Current Outpatient Medications on File Prior to Visit  Medication Sig  . escitalopram (LEXAPRO) 20 MG tablet Take 20 mg by mouth daily.   . ferrous sulfate 325 (65 FE) MG tablet Take 1 tablet (325 mg total) by mouth daily with breakfast.  . LATUDA 40 MG TABS tablet Take 1 tablet by mouth daily.  Marland Kitchen tretinoin (RETIN-A) 0.1 % cream Apply topically at bedtime.   No current facility-administered medications on file prior to visit.      Allergies  Allergen Reactions  . Nubain [Nalbuphine] Itching  . Scopolamine Itching  . Venlafaxine Hives    Heart palpitations, headache, burning skin, shakiness, dry mouth, urinary issues,  . Latex Itching and Rash    Review Of Systems:  Constitutional:   No  weight loss, night sweats,  Fevers, chills, + fatigue, or  lassitude.  HEENT:   No headaches,  Difficulty swallowing,  Tooth/dental problems, or  Sore throat,                No sneezing, itching, ear ache, nasal congestion, post nasal drip,   CV:  No chest pain,  Orthopnea, PND, swelling in lower extremities, anasarca, dizziness, palpitations, syncope.   GI  No heartburn, indigestion, abdominal pain, nausea, vomiting, diarrhea, change in bowel habits, loss of appetite, bloody stools.   Resp: No shortness of breath with exertion or at rest.  No excess mucus, no productive cough,  No non-productive cough,  No coughing up of blood.  No change in color of mucus.  No wheezing.  No chest wall deformity  Skin: no rash or lesions.  GU: no dysuria, change in color of urine, no urgency or frequency.  No flank pain, no hematuria   MS:  No joint pain or swelling.  No decreased range of motion.  No back pain.  Psych:  No change in mood or affect. No depression or anxiety.  No memory loss.   Vital Signs BP 118/74 (BP Location: Right Arm, Cuff Size: Normal)   Pulse 70   Temp 98.4 F (36.9 C) (Oral)   Ht 5\' 5"  (1.651 m)   Wt 153 lb (69.4 kg)   SpO2 98%   BMI 25.46 kg/m    Physical Exam:  General- No distress,  A&Ox3, pleasant ENT: No sinus tenderness, TM clear, pale nasal mucosa, no oral exudate,no post nasal drip, no LAN Cardiac: S1, S2, regular rate and rhythm, no murmur Chest: No wheeze/ rales/ dullness; no accessory muscle use, no nasal flaring, no sternal retractions Abd.: Soft Non-tender, ND, BS + Ext: No clubbing cyanosis, edema Neuro:  normal strength, MAE x 4, Cranial nerves intact Skin: No rashes,No lesions,   warm and dry Psych: normal mood and behavior   Assessment/Plan Continued significant apnea despite compliance with CPAP. Plan We will order a BiPAP titration This should allow to better treat your OSA. We will call you with the results of the titration study and make adjustments to your therapy as necessary.  Follow up with Dr. [place] 09/25/2019 as is scheduled Let 09/27/2019 know if you need [place] sooner.  This appointment was 30 min long with over 50% of the time in direct face-to-face patient care, assessment, plan of care, and follow-up.   Magdalen Spatz, NP 08/23/2019  12:24 PM

## 2019-08-26 NOTE — Telephone Encounter (Signed)
Order placed

## 2019-08-26 NOTE — Telephone Encounter (Signed)
I spoke to pt & made her aware the last info on price I was given for Bipap Titration is around 3700.00.  She states she would like to go ahead and get study done.  I told her I would send message to triage nurse so order can be placed and we can get it scheduled for her.  Bipap Titration order is needed.

## 2019-08-26 NOTE — Telephone Encounter (Signed)
We have received report from [facility] and it was given to Kandice Robinsons. She said she was going to discuss it with Dr. Maple Hudson. BIPAP titration has been ordered and patient is aware. Nothing further needed at this time.

## 2019-09-05 ENCOUNTER — Telehealth: Payer: Self-pay

## 2019-09-05 NOTE — Telephone Encounter (Signed)
Fax received from patient's Pharmacy needing a prior authorization for patient's Tretinoin Cream.

## 2019-09-05 NOTE — Telephone Encounter (Signed)
Prior authorization done through [facility] for the patient's Tretinoin Cream.      Your information has been submitted to [facility]. [employer] [place] will review the request and notify you of the determination decision directly, typically within 72 hours of receiving all information.  You will also receive your request decision electronically. To check for an update later, open this request again from your dashboard.  If [employer] [facility] has not responded within the specified timeframe or if you have any questions about your PA submission, contact Blue Cross [place] directly at [phone].

## 2019-09-10 ENCOUNTER — Other Ambulatory Visit: Payer: Self-pay

## 2019-09-10 ENCOUNTER — Ambulatory Visit (HOSPITAL_BASED_OUTPATIENT_CLINIC_OR_DEPARTMENT_OTHER): Payer: BC Managed Care – PPO | Attending: Acute Care | Admitting: Pulmonary Disease

## 2019-09-10 DIAGNOSIS — G4733 Obstructive sleep apnea (adult) (pediatric): Secondary | ICD-10-CM | POA: Diagnosis not present

## 2019-09-10 DIAGNOSIS — Z79899 Other long term (current) drug therapy: Secondary | ICD-10-CM | POA: Diagnosis not present

## 2019-09-11 DIAGNOSIS — G4733 Obstructive sleep apnea (adult) (pediatric): Secondary | ICD-10-CM

## 2019-09-11 NOTE — Procedures (Signed)
    Patient Name: Kelly, Fernandez Study Date: 09/10/2019 Gender: Female D.O.B: 24-Sep-1980 Age (years): 39 Referring Provider: Coralyn Helling MD, ABSM Height (inches): 65 Interpreting Physician: Coralyn Helling MD, ABSM Weight (lbs): 154 RPSGT: Ulyess Mort BMI: 26 MRN: [id number] Neck Size: 14.50  CLINICAL INFORMATION The patient is referred for a BiPAP titration to treat sleep apnea.  Date of sleep study January 2019: AHI 69.  SLEEP STUDY TECHNIQUE As per the AASM Manual for the Scoring of Sleep and Associated Events v2.3 (April 2016) with a hypopnea requiring 4% desaturations.  The channels recorded and monitored were frontal, central and occipital EEG, electrooculogram (EOG), submentalis EMG (chin), nasal and oral airflow, thoracic and abdominal wall motion, anterior tibialis EMG, snore microphone, electrocardiogram, and pulse oximetry. Bilevel positive airway pressure (BPAP) was initiated at the beginning of the study and titrated to treat sleep-disordered breathing.  MEDICATIONS Medications self-administered by patient taken the night of the study : latuda, FERROUS SULFATE, LEXAPRO  RESPIRATORY PARAMETERS Optimal IPAP Pressure (cm): 23 AHI at Optimal Pressure (/hr) 2.3 Optimal EPAP Pressure (cm): 19   Overall Minimal O2 (%): 89.0 Minimal O2 at Optimal Pressure (%): 96.0  SLEEP ARCHITECTURE Start Time: 9:45:48 PM Stop Time: 4:41:24 AM Total Time (min): 415.6 Total Sleep Time (min): 372.5 Sleep Latency (min): 19.1 Sleep Efficiency (%): 89.6% REM Latency (min): 186.5 WASO (min): 24.0 Stage N1 (%): 6.3% Stage N2 (%): 74.5% Stage N3 (%): 0.9% Stage R (%): 18.3 Supine (%): 58.36 Arousal Index (/hr): 15.3   CARDIAC DATA The 2 lead EKG demonstrated sinus rhythm. The mean heart rate was 63.7 beats per minute. Other EKG findings include: None.  LEG MOVEMENT DATA The total Periodic Limb Movements of Sleep (PLMS) were 0. The PLMS index was 0.0. A PLMS index of <15 is considered  normal in adults.  IMPRESSIONS - She did best with Bipap 23/19 cm H2O. - She did not require supplemental oxygen during this study.  DIAGNOSIS - Obstructive Sleep Apnea (327.23 [G47.33 ICD-10])  RECOMMENDATIONS - Trial of BiPAP therapy on 23/19 cm H2O with a Medium size Resmed Full Face Mask AirFit F20 mask and heated humidification.  [Electronically signed] 09/11/2019 01:09 PM  Coralyn Helling MD, ABSM Diplomate, American Board of Sleep Medicine   NPI: [id number]

## 2019-09-11 NOTE — Telephone Encounter (Signed)
Patient's prior authorization outcome.   This request has received a Cancelled outcome.  This may mean either your patient does not have active coverage with this plan, this authorization was processed as a duplicate request, or an authorization was not needed for this medication.  Note any additional information provided by [facility] at the bottom of this request, and contact [employer] [place] directly for further details.

## 2019-09-14 ENCOUNTER — Other Ambulatory Visit (HOSPITAL_COMMUNITY): Payer: BC Managed Care – PPO

## 2019-09-17 ENCOUNTER — Ambulatory Visit: Payer: BC Managed Care – PPO | Admitting: Dermatology

## 2019-09-17 ENCOUNTER — Ambulatory Visit (HOSPITAL_BASED_OUTPATIENT_CLINIC_OR_DEPARTMENT_OTHER): Payer: BC Managed Care – PPO | Admitting: Pulmonary Disease

## 2019-09-22 DIAGNOSIS — H66001 Acute suppurative otitis media without spontaneous rupture of ear drum, right ear: Secondary | ICD-10-CM | POA: Diagnosis not present

## 2019-09-24 ENCOUNTER — Telehealth: Payer: Self-pay | Admitting: Acute Care

## 2019-09-24 ENCOUNTER — Other Ambulatory Visit: Payer: Self-pay

## 2019-09-24 ENCOUNTER — Ambulatory Visit: Payer: BC Managed Care – PPO | Admitting: Family

## 2019-09-24 ENCOUNTER — Encounter: Payer: Self-pay | Admitting: Family

## 2019-09-24 VITALS — BP 106/71 | HR 62 | Temp 98.8°F | Resp 16 | Ht 65.0 in | Wt 153.0 lb

## 2019-09-24 DIAGNOSIS — H6691 Otitis media, unspecified, right ear: Secondary | ICD-10-CM | POA: Diagnosis not present

## 2019-09-24 DIAGNOSIS — R635 Abnormal weight gain: Secondary | ICD-10-CM | POA: Diagnosis not present

## 2019-09-24 DIAGNOSIS — G4733 Obstructive sleep apnea (adult) (pediatric): Secondary | ICD-10-CM

## 2019-09-24 DIAGNOSIS — Z9989 Dependence on other enabling machines and devices: Secondary | ICD-10-CM

## 2019-09-24 NOTE — Progress Notes (Signed)
Subjective:    Patient ID: [name], female    DOB: Mar 12, 1980, 39 y.o.   MRN: [id number]  HPI  Patient is a 39 yr old female who presents today to establish care.  OSA- had sleep study completed on 09/10/19 and a trial of BiPAP was recommended (rather than cpap).  She has follow up scheduled with sleep specialist later this week. Her cpap has not yet been changed to BiPAP. She is still having issues with daytime sleepiness.  Weight Gain- Reports that she feels like her food digests very slowly.  Reports regular BM's denies nausea.   Wt Readings from Last 3 Encounters:  09/24/19 153 lb (69.4 kg)  09/10/19 153 lb 9.6 oz (69.7 kg)  08/23/19 153 lb (69.4 kg)   Right Otitis Media- being treated by ENT.  Reports that she had ear surgery last year at [facility].   Reports that she is taking antibiotics per ENT.  She started on Friday (ear drops) and pills on Sunday.    Review of Systems See HPI  Past Medical History:  Diagnosis Date  . Anxiety   . Medical history non-contributory   . OSA (obstructive sleep apnea) '  . Postpartum care following cesarean delivery 06/09/2012  . S/P repeat low transverse C-section 06/09/2012  . S/P tubal ligation 06/09/2012     Social History   Socioeconomic History  . Marital status: Married    Spouse name: Not on file  . Number of children: 3  . Years of education: Not on file  . Highest education level: Not on file  Occupational History  . Occupation: Education administrator: [employer]  Tobacco Use  . Smoking status: Never Smoker  . Smokeless tobacco: Never Used  Vaping Use  . Vaping Use: Never used  Substance and Sexual Activity  . Alcohol use: Yes    Comment: Rarely   . Drug use: No  . Sexual activity: Yes    Partners: Male  Other Topics Concern  . Not on file  Social History Narrative   Has lived locally since 2013   3 children (twin girls borth 2014, 1 son 2006)   Works as a Conservation officer, historic buildings at [employer]   Enjoys spending time  with family pets (one dog, 2 turtles, fish, kids)   Married   Mother-in-law lives with them   She has a Scientist, water quality (MBA in Education officer, environmental)   Grew up in [place] moved here at age 54 for school   (sister and mom live in [place])    Social Determinants of Health   Financial Resource Strain: Low Risk   . Difficulty of Paying Living Expenses: Not hard at all  Food Insecurity: No Food Insecurity  . Worried About Programme researcher, broadcasting/film/video in the Last Year: Never true  . Ran Out of Food in the Last Year: Never true  Transportation Needs: No Transportation Needs  . Lack of Transportation (Medical): No  . Lack of Transportation (Non-Medical): No  Physical Activity: Insufficiently Active  . Days of Exercise per Week: 3 days  . Minutes of Exercise per Session: 30 min  Stress:   . Feeling of Stress :   Social Connections: Moderately Integrated  . Frequency of Communication with Friends and Family: More than three times a week  . Frequency of Social Gatherings with Friends and Family: Once a week  . Attends Religious Services: More than 4 times per year  . Active Member of Clubs or Organizations: No  .  Attends Banker Meetings: Never  . Marital Status: Married  Catering manager Violence: Not At Risk  . Fear of Current or Ex-Partner: No  . Emotionally Abused: No  . Physically Abused: No  . Sexually Abused: No    Past Surgical History:  Procedure Laterality Date  . CESAREAN SECTION  2006  . CESAREAN SECTION N/A 06/08/2012   Procedure: CESAREAN SECTION;  Surgeon: Robley Fries, MD;  Location: [facility];  Service: Obstetrics;  Laterality: N/A;  Repeat C/S  EDD: 06/24/12;Twins Monochorionic diamniotic  . TYMPANOPLASTY  2010  . TYMPANOPLASTY W/ MASTOIDECTOMY Right 10/02/2018    Family History  Problem Relation Age of Onset  . Pneumonia Father 61       died from pneumonia complications  . Spondylitis Mother     Allergies  Allergen Reactions  . Nubain [Nalbuphine] Itching  . Scopolamine Itching   . Venlafaxine Hives    Heart palpitations, headache, burning skin, shakiness, dry mouth, urinary issues,  . Latex Itching and Rash    Current Outpatient Medications on File Prior to Visit  Medication Sig Dispense Refill  . escitalopram (LEXAPRO) 20 MG tablet Take 20 mg by mouth daily.   0  . ferrous sulfate 325 (65 FE) MG tablet Take 1 tablet (325 mg total) by mouth daily with breakfast.  3  . LATUDA 40 MG TABS tablet Take 1 tablet by mouth daily.  1  . tretinoin (RETIN-A) 0.1 % cream Apply topically at bedtime. 45 g 0   No current facility-administered medications on file prior to visit.    BP 106/71 (BP Location: Right Arm, Patient Position: Sitting, Cuff Size: Small)   Pulse 62   Temp 98.8 F (37.1 C) (Oral)   Resp 16   Ht 5\' 5"  (1.651 m)   Wt 153 lb (69.4 kg)   BMI 25.46 kg/m       Objective:   Physical Exam Constitutional:      Appearance: She is well-developed.  HENT:     Right Ear: Ear canal normal. Tympanic membrane is erythematous.     Left Ear: Tympanic membrane and ear canal normal.  Neck:     Thyroid: No thyromegaly.  Cardiovascular:     Rate and Rhythm: Normal rate and regular rhythm.     Heart sounds: Normal heart sounds. No murmur heard.   Pulmonary:     Effort: Pulmonary effort is normal. No respiratory distress.     Breath sounds: Normal breath sounds. No wheezing.  Musculoskeletal:     Cervical back: Neck supple.  Skin:    General: Skin is warm and dry.  Neurological:     Mental Status: She is alert and oriented to person, place, and time.  Psychiatric:        Behavior: Behavior normal.        Thought Content: Thought content normal.        Judgment: Judgment normal.           Assessment & Plan:  Right Otitis media- still appears infected- she is advised to continue zpak/ciprodex drops as prescribed by ENT and keep her upcoming follow up appointment with them.  Weight gain- TSH was normal and weight has been stable.  Hopefully once  her OSA treatment is optimized she will have more energy and be able to be more active.  Lab Results  Component Value Date   TSH 1.12 08/14/2019   OSA- remains uncontrolled. Pt advised to keep her follow up appointment with the  sleep specialist as scheduled.   This visit occurred during the SARS-CoV-2 public health emergency.  Safety protocols were in place, including screening questions prior to the visit, additional usage of staff PPE, and extensive cleaning of exam room while observing appropriate contact time as indicated for disinfecting solutions.

## 2019-09-24 NOTE — Patient Instructions (Signed)
Please keep your upcoming appointment with the sleep specialist.

## 2019-09-24 NOTE — Telephone Encounter (Signed)
Have these new recommendations been ordered, and if so has patient been scheduled for a follow up appointment with down load? Thanks

## 2019-09-24 NOTE — Telephone Encounter (Signed)
Maralyn Sago, could you tell triage what recommendations you are referring to? Follow up appointment made for 09/26/2019 with [facility], video visit, patient is also registered in [place].

## 2019-09-25 ENCOUNTER — Ambulatory Visit: Payer: BC Managed Care – PPO | Admitting: Pulmonary Disease

## 2019-09-25 NOTE — Telephone Encounter (Signed)
Here were the recommendations per Dr Craige Cotta.  Sorry for the confusion.   IMPRESSIONS - She did best with Bipap 23/19 cm H2O. - She did not require supplemental oxygen during this study.  DIAGNOSIS - Obstructive Sleep Apnea (327.23 [G47.33 ICD-10])  RECOMMENDATIONS - Trial of BiPAP therapy on 23/19 cm H2O with a Medium size Resmed Full Face Mask AirFit F20 mask and heated humidification.

## 2019-09-25 NOTE — Telephone Encounter (Signed)
Patient has a visit with TP on 09/26/19 to discuss this.

## 2019-09-26 ENCOUNTER — Telehealth (INDEPENDENT_AMBULATORY_CARE_PROVIDER_SITE_OTHER): Payer: BC Managed Care – PPO | Admitting: Adult Health

## 2019-09-26 ENCOUNTER — Encounter: Payer: Self-pay | Admitting: Adult Health

## 2019-09-26 ENCOUNTER — Other Ambulatory Visit: Payer: Self-pay | Admitting: Adult Health

## 2019-09-26 DIAGNOSIS — G4733 Obstructive sleep apnea (adult) (pediatric): Secondary | ICD-10-CM

## 2019-09-26 NOTE — Patient Instructions (Addendum)
Will change to BIPAP At bedtime  .  Begin BiPAP-IPAP pressure 23/EPAP 19 cm H2O Continue to work on healthy weight Do not drive if sleepy Follow-up in 6 weeks after starting BiPAP with Dr. Vassie Loll  Or Eyvonne Burchfield NP and As needed

## 2019-09-26 NOTE — Progress Notes (Signed)
Virtual Visit via Video Note  I connected with [name] on 09/26/19 at 11:00 AM EDT by a video enabled telemedicine application and verified that I am speaking with the correct person using two identifiers.  Location: Patient: Home  Provider: Office    I discussed the limitations of evaluation and management by telemedicine and the availability of in person appointments. The patient expressed understanding and agreed to proceed.  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  Patient was seen last visit for a follow-up of her sleep apnea and CPAP usage.  Patient was very compliant but continued to have daytime sleepiness and download showed excellent compliance but continued to have significant events with an AHI at 14.4.  Patient was set up for a titration study.  Patient required BiPAP support at 23/19 cm of H2O.  AHI at optimal pressure was 2.3/hour She did not require supplemental oxygen during study.  She was tried on a full facemask AirFit F 20.  We discussed her BiPAP titration results and need to change from CPAP to BiPAP device.  Patient verbalizes understanding.  Past Medical History:  Diagnosis Date  . Anxiety   . Medical history non-contributory   . OSA (obstructive sleep apnea) '  . Postpartum care following cesarean delivery 06/09/2012  . S/P repeat low transverse C-section 06/09/2012  . S/P tubal ligation 06/09/2012   Current Outpatient Medications on File Prior to Visit  Medication Sig Dispense Refill  . ciprofloxacin-dexamethasone (CIPRODEX) OTIC suspension Apply 5 drops in affected ear BID for 14 days. Dry ear precautions.    Marland Kitchen escitalopram (LEXAPRO) 20 MG tablet Take 20 mg by mouth daily.   0  . ferrous sulfate 325 (65 FE) MG tablet Take 1 tablet (325 mg total) by mouth  daily with breakfast.  3  . LATUDA 40 MG TABS tablet Take 1 tablet by mouth daily.  1   No current facility-administered medications on file prior to visit.      Observations/Objective: Speaks in full sentences appears calm and in no distress  Home sleep test 03/2017 showed severe OSA with AHI 68/hour with OSA.  Assessment and Plan: Severe obstructive sleep apnea with excellent control but continues to have significant events despite nocturnal CPAP She was set up for a BiPAP titration study that shows improved control on nocturnal BiPAP at 23/19 centimeters H2O.  Plan  Patient Instructions  Will change to BIPAP At bedtime  .  Begin BiPAP-IPAP pressure 23/EPAP 19 cm H2O Continue to work on healthy weight Do not drive if sleepy Follow-up in 6 weeks after starting BiPAP with Dr. Vassie Loll  Or Kelly Boutin NP and As needed         Follow Up Instructions: Follow-up in 6 weeks after starting BiPAP   I discussed the assessment and treatment plan with the patient. The patient was provided an opportunity to ask questions and all were answered. The patient agreed with the plan and demonstrated an understanding of the instructions.   The patient was advised to call back or seek an in-person evaluation if the symptoms worsen or if the condition fails to improve as anticipated.  I provided 22 minutes of non-face-to-face time during this encounter.   Rubye Oaks, NP

## 2019-09-30 ENCOUNTER — Telehealth: Payer: Self-pay | Admitting: Adult Health

## 2019-09-30 DIAGNOSIS — G4733 Obstructive sleep apnea (adult) (pediatric): Secondary | ICD-10-CM

## 2019-09-30 NOTE — Telephone Encounter (Signed)
Please change BIPAP settings.  Dr. Vassie Loll  With review of BIPAP titration study recommends  Please change to auto-BiPAP - EPAP min 10, PS +4, IPAP max 23

## 2019-10-01 NOTE — Telephone Encounter (Signed)
Spoke with pt. She is aware of her results. Order has been placed per TP. Nothing further was needed.

## 2019-10-02 ENCOUNTER — Telehealth: Payer: Self-pay | Admitting: Adult Health

## 2019-10-02 ENCOUNTER — Telehealth: Payer: Self-pay | Admitting: Pulmonary Disease

## 2019-10-02 DIAGNOSIS — Z9989 Dependence on other enabling machines and devices: Secondary | ICD-10-CM

## 2019-10-02 NOTE — Telephone Encounter (Signed)
I called and spoke with the pt  She states that [facility] told her that it would be 3-4 wks before she could get her BIPAP machine due to recall  She is asking if we can now increased her current CPAP pressure to get her through until then  Please advise thanks

## 2019-10-02 NOTE — Telephone Encounter (Signed)
Called and spoke to pt, who is requesting update on pressure change.  Pt is aware that order was placed to [facility] on 10/01/2019 for bipap Pt is aware that Christoper Allegra will contact her regarding bipap. Pt voiced her understanding and had no further questions.  Nothing further is needed at this time.

## 2019-10-03 NOTE — Telephone Encounter (Signed)
Last notes said on 5 to 20cm  Would increase CPAP to 10 to 20cm auto .  She is essentially on the highest pressure at 20cm. Will increase lower number to help some .

## 2019-10-03 NOTE — Telephone Encounter (Signed)
Spoke with the pt and notified of recs per TP  Order sent to [facility]

## 2019-10-04 ENCOUNTER — Encounter: Payer: BC Managed Care – PPO | Admitting: Family

## 2019-10-10 DIAGNOSIS — G4733 Obstructive sleep apnea (adult) (pediatric): Secondary | ICD-10-CM | POA: Diagnosis not present

## 2019-10-14 ENCOUNTER — Telehealth: Payer: Self-pay | Admitting: Adult Health

## 2019-10-14 DIAGNOSIS — G4733 Obstructive sleep apnea (adult) (pediatric): Secondary | ICD-10-CM

## 2019-10-14 NOTE — Telephone Encounter (Signed)
Spoke with the pt  She states that she thinks that her BIPAP pressure setting is wrong  She states notes from mychart state: Begin BiPAP-IPAP pressure 23/EPAP 19 cm H2O We ordered bipap: BiPAP: Max IPAP = 23     Min EPAP = 10     Pressure Support = +4  Tammy- can you please clarify, thanks

## 2019-10-14 NOTE — Telephone Encounter (Signed)
That because it is on Auto set , so the lowest it can go is 10.  The recommendations from BIPAP titration was 23/19 (which are high setting . Start on Auto for comfort and then adjust as needed.   Did she get the BIPAP ?

## 2019-10-15 DIAGNOSIS — Z9089 Acquired absence of other organs: Secondary | ICD-10-CM | POA: Diagnosis not present

## 2019-10-15 DIAGNOSIS — Z91040 Latex allergy status: Secondary | ICD-10-CM | POA: Diagnosis not present

## 2019-10-15 DIAGNOSIS — Z48810 Encounter for surgical aftercare following surgery on the sense organs: Secondary | ICD-10-CM | POA: Diagnosis not present

## 2019-10-15 DIAGNOSIS — Z888 Allergy status to other drugs, medicaments and biological substances status: Secondary | ICD-10-CM | POA: Diagnosis not present

## 2019-10-15 DIAGNOSIS — H902 Conductive hearing loss, unspecified: Secondary | ICD-10-CM | POA: Diagnosis not present

## 2019-10-15 DIAGNOSIS — H7191 Unspecified cholesteatoma, right ear: Secondary | ICD-10-CM | POA: Diagnosis not present

## 2019-10-15 DIAGNOSIS — H9011 Conductive hearing loss, unilateral, right ear, with unrestricted hearing on the contralateral side: Secondary | ICD-10-CM | POA: Diagnosis not present

## 2019-10-15 NOTE — Telephone Encounter (Signed)
lmtcb for pt regarding bipap.

## 2019-10-16 NOTE — Telephone Encounter (Signed)
Parrett, Kelly Bouquet, NP  6:29 PM Note Please change BIPAP settings.  Dr. Vassie Loll  With review of BIPAP titration study recommends  Please change to auto-BiPAP - EPAP min 10, PS +4, IPAP max 23       Spoke with TP in regards to pt's settings. Per TP, the above settings for pt's BIPAP is what needs to be sent to DME. I have cancelled original order that was placed and have placed new order with these settings.   Called [facility] and spoke with Tonya to clarify what pt's BIPAP settings should be and she verbalized understanding. Nothing further needed.

## 2019-10-16 NOTE — Telephone Encounter (Signed)
Observations/Objective: Speaks in full sentences appears calm and in no distress  Home sleep test 03/2017 showed severe OSA with AHI 68/hourwith OSA.  Assessment and Plan: Severe obstructive sleep apnea with excellent control but continues to have significant events despite nocturnal CPAP She was set up for a BiPAP titration study that shows improved control on nocturnal BiPAP at 23/19 centimeters H2O.  Plan  Patient Instructions  Will change to BIPAP At bedtime  .  Begin BiPAP-IPAP pressure 23/EPAP 19 cm H2O Continue to work on healthy weight Do not drive if sleepy Follow-up in 6 weeks after starting BiPAP with Dr. Vassie Loll  Or Parrett NP and As needed     No order had been placed for the settings of IPAP 23 EPAP 19. I have placed that order.  Called and spoke with pt letting her know this had been done and she verbalized understanding. Nothing further needed.

## 2019-10-16 NOTE — Telephone Encounter (Signed)
Pt calling back regarding BiPap pressure settings being changed.  Christoper Allegra needs an order.  Please advise.  [phone]

## 2019-10-16 NOTE — Addendum Note (Signed)
Addended by: Wyvonne Lenz on: 10/16/2019 11:39 AM   Modules accepted: Orders

## 2019-10-17 ENCOUNTER — Telehealth: Payer: Self-pay | Admitting: Adult Health

## 2019-10-17 NOTE — Telephone Encounter (Signed)
Called and spoke with Selena Batten, RT with [facility] clarifying pt's BIPAP settings. Kim verbalized understanding. Nothing further needed.

## 2019-10-28 DIAGNOSIS — F333 Major depressive disorder, recurrent, severe with psychotic symptoms: Secondary | ICD-10-CM | POA: Diagnosis not present

## 2019-11-04 ENCOUNTER — Encounter: Payer: BC Managed Care – PPO | Admitting: Family

## 2019-11-10 DIAGNOSIS — G4733 Obstructive sleep apnea (adult) (pediatric): Secondary | ICD-10-CM | POA: Diagnosis not present

## 2019-11-18 DIAGNOSIS — Z6825 Body mass index (BMI) 25.0-25.9, adult: Secondary | ICD-10-CM | POA: Diagnosis not present

## 2019-11-18 DIAGNOSIS — Z1231 Encounter for screening mammogram for malignant neoplasm of breast: Secondary | ICD-10-CM | POA: Diagnosis not present

## 2019-11-18 DIAGNOSIS — Z01419 Encounter for gynecological examination (general) (routine) without abnormal findings: Secondary | ICD-10-CM | POA: Diagnosis not present

## 2019-11-18 LAB — HM MAMMOGRAPHY

## 2019-11-22 ENCOUNTER — Telehealth: Payer: Self-pay | Admitting: *Deleted

## 2019-11-22 NOTE — Addendum Note (Signed)
Addended by: Steph Cheadle A on: 11/22/2019 03:27 PM   Modules accepted: Orders  

## 2019-11-22 NOTE — Addendum Note (Signed)
Addended by: Mervin Kung A on: 11/22/2019 03:27 PM   Modules accepted: Orders

## 2019-11-25 ENCOUNTER — Other Ambulatory Visit: Payer: Self-pay

## 2019-11-25 ENCOUNTER — Other Ambulatory Visit (INDEPENDENT_AMBULATORY_CARE_PROVIDER_SITE_OTHER): Payer: BC Managed Care – PPO

## 2019-11-25 DIAGNOSIS — E611 Iron deficiency: Secondary | ICD-10-CM

## 2019-11-25 NOTE — Telephone Encounter (Signed)
Opened in error

## 2019-11-26 LAB — CBC WITH DIFFERENTIAL/PLATELET
Absolute Monocytes: 370 cells/uL (ref 200–950)
Basophils Absolute: 46 cells/uL (ref 0–200)
Basophils Relative: 0.6 %
Eosinophils Absolute: 92 cells/uL (ref 15–500)
Eosinophils Relative: 1.2 %
HCT: 37.5 % (ref 35.0–45.0)
Hemoglobin: 12 g/dL (ref 11.7–15.5)
Lymphs Abs: 2687 cells/uL (ref 850–3900)
MCH: 27.8 pg (ref 27.0–33.0)
MCHC: 32 g/dL (ref 32.0–36.0)
MCV: 86.8 fL (ref 80.0–100.0)
MPV: 12.4 fL (ref 7.5–12.5)
Monocytes Relative: 4.8 %
Neutro Abs: 4505 cells/uL (ref 1500–7800)
Neutrophils Relative %: 58.5 %
Platelets: 196 10*3/uL (ref 140–400)
RBC: 4.32 10*6/uL (ref 3.80–5.10)
RDW: 14.1 % (ref 11.0–15.0)
Total Lymphocyte: 34.9 %
WBC: 7.7 10*3/uL (ref 3.8–10.8)

## 2019-11-26 LAB — IRON, TOTAL/TOTAL IRON BINDING CAP
%SAT: 6 % (calc) — ABNORMAL LOW (ref 16–45)
Iron: 24 ug/dL — ABNORMAL LOW (ref 40–190)
TIBC: 418 mcg/dL (calc) (ref 250–450)

## 2019-11-28 ENCOUNTER — Telehealth: Payer: Self-pay | Admitting: Family

## 2019-11-28 NOTE — Telephone Encounter (Signed)
See mychart.  

## 2019-12-10 DIAGNOSIS — G4733 Obstructive sleep apnea (adult) (pediatric): Secondary | ICD-10-CM | POA: Diagnosis not present

## 2019-12-16 ENCOUNTER — Ambulatory Visit: Payer: BC Managed Care – PPO | Admitting: Adult Health

## 2019-12-19 ENCOUNTER — Other Ambulatory Visit: Payer: Self-pay

## 2019-12-19 ENCOUNTER — Encounter: Payer: Self-pay | Admitting: Adult Health

## 2019-12-19 ENCOUNTER — Ambulatory Visit: Payer: BC Managed Care – PPO | Admitting: Adult Health

## 2019-12-19 VITALS — BP 110/76 | HR 84 | Ht 65.0 in | Wt 151.0 lb

## 2019-12-19 DIAGNOSIS — G4733 Obstructive sleep apnea (adult) (pediatric): Secondary | ICD-10-CM | POA: Diagnosis not present

## 2019-12-19 NOTE — Assessment & Plan Note (Signed)
Severe obstructive sleep apnea despite excellent compliance did not have good control on nocturnal CPAP. She did have a BiPAP titration study that showed improvement control on BiPAP. Her new BiPAP download shows excellent compliance she continues to have a number of events. We will adjust auto BiPAP to see if we can decrease her number of events. Will change auto BiPAP max IPAP at 23 and increase minimum EPAP to 12 cm H2O. We will check a BiPAP download in 1 month.  Plan  Patient Instructions  Will change to BIPAP pressure :  Auto-BiPAP - EPAP min 12, PS +4, IPAP max 23 Keep up good work.  Continue to work on healthy weight Do not drive if sleepy BIPAP download in 1 month.  Follow-up in 4-6 months with Dr. Vassie Loll  And As needed

## 2019-12-19 NOTE — Patient Instructions (Signed)
Will change to BIPAP pressure :  Auto-BiPAP - EPAP min 12, PS +4, IPAP max 23 Keep up good work.  Continue to work on healthy weight Do not drive if sleepy BIPAP download in 1 month.  Follow-up in 4-6 months with Dr. Vassie Loll  And As needed

## 2019-12-19 NOTE — Progress Notes (Signed)
@Patient  ID: , female    DOB: 09/30/1980, 39 y.o.   MRN: [id number]  Chief Complaint  Patient presents with  . 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 Patient has underlying severe obstructive sleep apnea. She had excellent compliance on her nocturnal CPAP however continued to have significant number of apneic events. She was set up for a BiPAP titration study that showed optimal control on BiPAP 23/19 cm H2O. Her AHI at optimal pressure was 2.3/hour. Last visit she was changed over to BiPAP. She was set up on auto BiPAP with IPAP max 23 and EPAP low 10 cm H2O. She returns today saying that she does feel like she is some better she has noticed less sleepiness and feels more refreshed. She is using her BiPAP every single night. Says she falls asleep very easily does not feel that the pressure is too much and likes her mask. BiPAP download shows excellent compliance with daily average usage is at 7.5 hours. Patient's max IPAP 23, minimum EPAP 10 and pressure 44 cm H2O. AHI 14/hour. Minimum leaks.    Allergies  Allergen Reactions  . Nubain [Nalbuphine] Itching  . Scopolamine Itching  . Venlafaxine Hives    Heart palpitations, headache, burning skin, shakiness, dry mouth, urinary issues,  . Latex Itching and Rash    Immunization History  Administered Date(s) Administered  . Influenza Split 12/12/2011  . Influenza,inj,Quad PF,6+ Mos 03/27/2013, 12/13/2016  . PFIZER SARS-COV-2 Vaccination 05/16/2019, 06/12/2019  . Tdap 04/23/2012    Past Medical History:  Diagnosis  Date  . Anxiety   . Medical history non-contributory   . OSA (obstructive sleep apnea) '  . Postpartum care following cesarean delivery 06/09/2012  . S/P repeat low transverse C-section 06/09/2012  . S/P tubal ligation 06/09/2012    Tobacco History: Social History   Tobacco Use  Smoking Status Never Smoker  Smokeless Tobacco Never Used   Counseling given: Not Answered   Outpatient Medications Prior to Visit  Medication Sig Dispense Refill  . escitalopram (LEXAPRO) 20 MG tablet Take 20 mg by mouth daily.   0  . ferrous sulfate 325 (65 FE) MG tablet Take 1 tablet (325 mg total) by mouth daily with breakfast.  3  . LATUDA 40 MG TABS tablet Take 1 tablet by mouth daily.  1  . ciprofloxacin-dexamethasone (CIPRODEX) OTIC suspension Apply 5 drops in affected ear BID for 14 days. Dry ear precautions.     No facility-administered medications prior to visit.     Review of Systems:   Constitutional:   No  weight loss, night sweats,  Fevers, chills, + fatigue, or  lassitude.  HEENT:   No headaches,  Difficulty swallowing,  Tooth/dental problems, or  Sore throat,                No sneezing, itching, ear ache, nasal congestion, post nasal drip,   CV:  No chest pain,  Orthopnea, PND, swelling in lower extremities, anasarca, dizziness, palpitations, syncope.   GI  No heartburn, indigestion, abdominal pain, nausea, vomiting, diarrhea, change in bowel habits, loss of appetite,  bloody stools.   Resp: No shortness of breath with exertion or at rest.  No excess mucus, no productive cough,  No non-productive cough,  No coughing up of blood.  No change in color of mucus.  No wheezing.  No chest wall deformity  Skin: no rash or lesions.  GU: no dysuria, change in color of urine, no urgency or frequency.  No flank pain, no hematuria   MS:  No joint pain or swelling.  No decreased range of motion.  No back pain.    Physical Exam  BP 110/76 (BP Location: Left Arm, Cuff Size: Normal)   Pulse 84    Ht 5\' 5"  (1.651 m)   Wt 151 lb (68.5 kg)   SpO2 96%   BMI 25.13 kg/m   GEN: A/Ox3; pleasant , NAD, well nourished    HEENT:  [place]/AT,  NOSE-clear, THROAT-clear, no lesions, no postnasal drip or exudate noted. Class III MP airway NECK:  Supple w/ fair ROM; no JVD; normal carotid impulses w/o bruits; no thyromegaly or nodules palpated; no lymphadenopathy.    RESP  Clear  P & A; w/o, wheezes/ rales/ or rhonchi. no accessory muscle use, no dullness to percussion  CARD:  RRR, no m/r/g, no peripheral edema, pulses intact, no cyanosis or clubbing.  GI:   Soft & nt; nml bowel sounds; no organomegaly or masses detected.   Musco: Warm bil, no deformities or joint swelling noted.   Neuro: alert, no focal deficits noted.    Skin: Warm, no lesions or rashes    Lab Results:  CBC  BMET  BNP No results found for: BNP  ProBNP No results found for: PROBNP  Imaging: No results found.    No flowsheet data found.  No results found for: NITRICOXIDE      Assessment & Plan:   OSA (obstructive sleep apnea) Severe obstructive sleep apnea despite excellent compliance did not have good control on nocturnal CPAP. She did have a BiPAP titration study that showed improvement control on BiPAP. Her new BiPAP download shows excellent compliance she continues to have a number of events. We will adjust auto BiPAP to see if we can decrease her number of events. Will change auto BiPAP max IPAP at 23 and increase minimum EPAP to 12 cm H2O. We will check a BiPAP download in 1 month.  Plan  Patient Instructions  Will change to BIPAP pressure :  Auto-BiPAP - EPAP min 12, PS +4, IPAP max 23 Keep up good work.  Continue to work on healthy weight Do not drive if sleepy BIPAP download in 1 month.  Follow-up in 4-6 months with Dr.  And As needed             Vassie Loll, NP 12/19/2019

## 2019-12-25 ENCOUNTER — Ambulatory Visit: Payer: BC Managed Care – PPO | Admitting: Family

## 2019-12-30 ENCOUNTER — Ambulatory Visit: Payer: BC Managed Care – PPO | Admitting: Adult Health

## 2020-01-10 DIAGNOSIS — G4733 Obstructive sleep apnea (adult) (pediatric): Secondary | ICD-10-CM | POA: Diagnosis not present

## 2020-01-20 DIAGNOSIS — F333 Major depressive disorder, recurrent, severe with psychotic symptoms: Secondary | ICD-10-CM | POA: Diagnosis not present

## 2020-01-21 ENCOUNTER — Ambulatory Visit: Payer: BC Managed Care – PPO | Attending: Internal Medicine

## 2020-01-21 ENCOUNTER — Other Ambulatory Visit (HOSPITAL_BASED_OUTPATIENT_CLINIC_OR_DEPARTMENT_OTHER): Payer: Self-pay | Admitting: Internal Medicine

## 2020-01-21 DIAGNOSIS — Z23 Encounter for immunization: Secondary | ICD-10-CM

## 2020-01-21 MED FILL — PFIZER-BIONTECH COVID-19 VA: 30 | 1 days supply | Qty: 0 | Fill #0

## 2020-01-21 NOTE — Progress Notes (Signed)
   Covid-19 Vaccination Clinic  Name:  [name]    MRN: [id number] DOB: Sep 04, 1980  01/21/2020  [name] was observed post Covid-19 immunization for 15 minutes without incident. She was provided with Vaccine Information Sheet and instruction to access the V-Safe system.   [name] was instructed to call 911 with any severe reactions post vaccine: Marland Kitchen Difficulty breathing  . Swelling of face and throat  . A fast heartbeat  . A bad rash all over body  . Dizziness and weakness   Immunizations Administered    Name Date Dose VIS Date Route   Pfizer COVID-19 Vaccine 01/21/2020  2:42 PM 0.3 mL 12/25/2019 Intramuscular   Manufacturer: [employer], [employer]   Lot: [id number]   NDC: [address]-8

## 2020-01-28 ENCOUNTER — Ambulatory Visit: Payer: BC Managed Care – PPO

## 2020-02-09 DIAGNOSIS — G4733 Obstructive sleep apnea (adult) (pediatric): Secondary | ICD-10-CM | POA: Diagnosis not present

## 2020-03-11 DIAGNOSIS — G4733 Obstructive sleep apnea (adult) (pediatric): Secondary | ICD-10-CM | POA: Diagnosis not present

## 2020-05-08 ENCOUNTER — Telehealth: Payer: Self-pay | Admitting: Pulmonary Disease

## 2020-05-08 NOTE — Telephone Encounter (Signed)
OK to send letter + sleep study .. is under our care for OSA. Her sleep study showed severe OSA & she has been prescribed CPAP . Cpap has helped imporve her daytime somnolence & fatigue, energy levels & overall health.

## 2020-05-08 NOTE — Telephone Encounter (Signed)
Called and spoke with the pt She states that she is needing letter of medical necessity for CPAP faxed to [facility] and also they will need her sleep study  Please advise on letter, thanks

## 2020-05-11 NOTE — Telephone Encounter (Signed)
Called and spoke with patient to let her know that we were going to send letter and copy of sleep study to [facility]. Called number she provided to get fax number but was unsuccessful with automated options. Called patient back to see if she can get fax number that we need and call us back. Patient states she will get it and call us back. Will wait to hear from patient

## 2020-05-11 NOTE — Telephone Encounter (Signed)
Called and spoke with Christoper Allegra and was told that they will be faxing over all paperwork for the patient. She stated that [employer] sends them a letter and also sends the patient a letter. She asked me to let patient know if they need anything else for the patient to call them and provide her with the number. Called and informed patient that they should be working on everything for her and provided her with the number to call them. Advised her that she does not need to call right now but if she hears something else from [facility] then she can call. She expressed understanding. Nothing further needed at this time.

## 2020-08-10 ENCOUNTER — Telehealth: Payer: Self-pay | Admitting: Pulmonary Disease

## 2020-08-10 NOTE — Telephone Encounter (Signed)
Called and spoke with patient who states she has BIPAP machine. States supposed to have CPAP machine. Patient uses [facility] for DME. Clarified with patient that per last office visit on 12/19/2019 with T. Parrett NP:  "Will change to BIPAP pressure :  Auto-BiPAP - EPAP min 12, PS +4, IPAP max 23 Keep up good work.  Continue to work on healthy weight Do not drive if sleepy BIPAP download in 1 month.  Follow-up in 4-6 months with Dr. Vassie Loll  And As needed"  Patient expressed full understanding. Scheduled office visit with Dr Vassie Loll for Thursday 09/17/2020 at 9:15am at the [facility] office. Patient agreeable with time, date and location. Nothing further needed at this time.

## 2020-08-17 ENCOUNTER — Telehealth: Payer: Self-pay

## 2020-08-17 ENCOUNTER — Telehealth (INDEPENDENT_AMBULATORY_CARE_PROVIDER_SITE_OTHER): Payer: BC Managed Care – PPO | Admitting: Family Medicine

## 2020-08-17 ENCOUNTER — Other Ambulatory Visit: Payer: Self-pay

## 2020-08-17 ENCOUNTER — Encounter: Payer: Self-pay | Admitting: Family Medicine

## 2020-08-17 ENCOUNTER — Ambulatory Visit (HOSPITAL_BASED_OUTPATIENT_CLINIC_OR_DEPARTMENT_OTHER)
Admission: RE | Admit: 2020-08-17 | Discharge: 2020-08-17 | Disposition: A | Discharge status: Home / Self Care | Payer: BC Managed Care – PPO | Source: Ambulatory Visit | Attending: Family Medicine | Admitting: Family Medicine

## 2020-08-17 DIAGNOSIS — U071 COVID-19: Secondary | ICD-10-CM

## 2020-08-17 MED ORDER — PROMETHAZINE-DM 6.25-15 MG/5ML PO SYRP: 5.0000 mL | ORAL_SOLUTION | Freq: Four times a day (QID) | ORAL | 0 refills | Status: DC | PRN

## 2020-08-17 MED ORDER — PREDNISONE 10 MG PO TABS: ORAL_TABLET | ORAL | 0 refills | Status: DC

## 2020-08-17 NOTE — Assessment & Plan Note (Signed)
Pt did not want paxlovid pred taper and cough med sent in Call if symptoms do not improve

## 2020-08-17 NOTE — Progress Notes (Signed)
MyChart Video Visit    Virtual Visit via Video Note   This visit type was conducted due to national recommendations for restrictions regarding the COVID-19 Pandemic (e.g. social distancing) in an effort to limit this patient's exposure and mitigate transmission in our community. This patient is at least at moderate risk for complications without adequate follow up. This format is felt to be most appropriate for this patient at this time. Physical exam was limited by quality of the video and audio technology used for the visit. [name] was able to get the patient set up on a video visit.  Patient location: Home Patient and provider in visit Provider location: Office  I discussed the limitations of evaluation and management by telemedicine and the availability of in person appointments. The patient expressed understanding and agreed to proceed.  Visit Date: 08/17/2020  Today's healthcare provider: Donato Schultz, DO     Subjective:    Patient ID: [name], female    DOB: 31-Oct-1980, 40 y.o.   MRN: [id number]  No chief complaint on file.   HPI Patient is in today for + covid -- she tested positive on Friday   sat/ sun she was tired ---+ sore throat  and cough.    Past Medical History:  Diagnosis Date   Anxiety    Medical history non-contributory    OSA (obstructive sleep apnea) '   Postpartum care following cesarean delivery 06/09/2012   S/P repeat low transverse C-section 06/09/2012   S/P tubal ligation 06/09/2012    Past Surgical History:  Procedure Laterality Date   CESAREAN SECTION  2006   CESAREAN SECTION N/A 06/08/2012   Procedure: CESAREAN SECTION;  Surgeon: Robley Fries, MD;  Location: [facility];  Service: Obstetrics;  Laterality: N/A;  Repeat C/S  EDD: 06/24/12;Twins Monochorionic diamniotic   TYMPANOPLASTY  2010   TYMPANOPLASTY W/ MASTOIDECTOMY Right 10/02/2018    Family History  Problem Relation Age of Onset   Pneumonia Father 49       died  from pneumonia complications   Spondylitis Mother     Social History   Socioeconomic History   Marital status: Married    Spouse name: Not on file   Number of children: 3   Years of education: Not on file   Highest education level: Not on file  Occupational History   Occupation: Systems developer     Employer: [employer]  Tobacco Use   Smoking status: Never   Smokeless tobacco: Never  Vaping Use   Vaping Use: Never used  Substance and Sexual Activity   Alcohol use: Yes    Comment: Rarely    Drug use: No   Sexual activity: Yes    Partners: Male  Other Topics Concern   Not on file  Social History Narrative   Has lived locally since 2013   3 children (twin girls borth 2014, 1 son 2006)   Works as a Conservation officer, historic buildings at [employer]   Enjoys spending time with family pets (one dog, 2 turtles, fish, kids)   Married   Mother-in-law lives with them   She has a Scientist, water quality (MBA in Education officer, environmental)   Grew up in [place] moved here at age 42 for school   (sister and mom live in [place])    Social Determinants of Health   Financial Resource Strain: Not on file  Food Insecurity: Not on file  Transportation Needs: Not on file  Physical Activity: Not on file  Stress: Not on  file  Social Connections: Not on file  Intimate Partner Violence: Not on file    Outpatient Medications Prior to Visit  Medication Sig Dispense Refill   COVID-19 mRNA vaccine, Pfizer, 30 MCG/0.3ML injection AS DIRECTED .3 mL 0   escitalopram (LEXAPRO) 20 MG tablet Take 20 mg by mouth daily.   0   ferrous sulfate 325 (65 FE) MG tablet Take 1 tablet (325 mg total) by mouth daily with breakfast.  3   LATUDA 40 MG TABS tablet Take 1 tablet by mouth daily.  1   No facility-administered medications prior to visit.    Allergies  Allergen Reactions   Nubain [Nalbuphine] Itching   Scopolamine Itching   Venlafaxine Hives    Heart palpitations, headache, burning skin, shakiness, dry mouth, urinary issues,   Latex Itching and Rash     Review of Systems  Constitutional:  Negative for diaphoresis and weight loss.  HENT:  Positive for congestion and sore throat.   Gastrointestinal:  Negative for blood in stool, constipation and diarrhea.  Musculoskeletal:  Negative for falls.  Neurological:  Negative for tremors, focal weakness, seizures and weakness.      Objective:    Physical Exam Vitals and nursing note reviewed.  Pulmonary:     Effort: Pulmonary effort is normal.  Neurological:     Mental Status: She is alert.  Psychiatric:        Mood and Affect: Mood normal.        Behavior: Behavior normal.        Thought Content: Thought content normal.        Judgment: Judgment normal.    There were no vitals taken for this visit. Wt Readings from Last 3 Encounters:  12/19/19 151 lb (68.5 kg)  09/24/19 153 lb (69.4 kg)  09/10/19 153 lb 9.6 oz (69.7 kg)    Diabetic Foot Exam - Simple   No data filed    Lab Results  Component Value Date   WBC 7.7 11/25/2019   HGB 12.0 11/25/2019   HCT 37.5 11/25/2019   PLT 196 11/25/2019   GLUCOSE 89 10/02/2018   CHOL 148 10/02/2018   TRIG 117.0 10/02/2018   HDL 40.40 10/02/2018   LDLCALC 84 10/02/2018   ALT 10 10/02/2018   AST 16 10/02/2018   NA 137 10/02/2018   K 4.2 10/02/2018   CL 102 10/02/2018   CREATININE 0.95 10/02/2018   BUN 15 10/02/2018   CO2 28 10/02/2018   TSH 1.12 08/14/2019    Lab Results  Component Value Date   TSH 1.12 08/14/2019   Lab Results  Component Value Date   WBC 7.7 11/25/2019   HGB 12.0 11/25/2019   HCT 37.5 11/25/2019   MCV 86.8 11/25/2019   PLT 196 11/25/2019   Lab Results  Component Value Date   NA 137 10/02/2018   K 4.2 10/02/2018   CO2 28 10/02/2018   GLUCOSE 89 10/02/2018   BUN 15 10/02/2018   CREATININE 0.95 10/02/2018   BILITOT 0.3 10/02/2018   ALKPHOS 67 10/02/2018   AST 16 10/02/2018   ALT 10 10/02/2018   PROT 7.1 10/02/2018   ALBUMIN 4.3 10/02/2018   CALCIUM 9.3 10/02/2018   ANIONGAP 7 12/27/2014    GFR 65.30 10/02/2018   Lab Results  Component Value Date   CHOL 148 10/02/2018   Lab Results  Component Value Date   HDL 40.40 10/02/2018   Lab Results  Component Value Date   LDLCALC 84 10/02/2018  Lab Results  Component Value Date   TRIG 117.0 10/02/2018   Lab Results  Component Value Date   CHOLHDL 4 10/02/2018   No results found for: HGBA1C     Assessment & Plan:   Problem List Items Addressed This Visit   None   No orders of the defined types were placed in this encounter.   I discussed the assessment and treatment plan with the patient. The patient was provided an opportunity to ask questions and all were answered. The patient agreed with the plan and demonstrated an understanding of the instructions.   The patient was advised to call back or seek an in-person evaluation if the symptoms worsen or if the condition fails to improve as anticipated.     Donato Schultz, DO [facility] [phone] (phone) [phone] (fax)  [facility]

## 2020-08-17 NOTE — Telephone Encounter (Signed)
Pt has been scheduled for today with Dr Laury Axon.   [facility] - Client Client Site [place] [facility] - Night Physician [name] - NP Contact Type Call Who Is Calling Patient / Member / Family / Caregiver Caller Name Euline Kimbler Caller Phone Number [phone] Patient Name [name] Patient DOB 1980/12/31 Call Type Message Only Information Provided Reason for Call Request to Schedule Office Appointment Initial Comment Caller states, sick and wanting an appt. Declined rn Patient request to speak to RN No Disp. Time Disposition Final User 08/17/2020 7:17:42 AM General Information Provided Yes Terrilee Files Call Closed By: Terrilee Files Transaction Date/Time: 08/17/2020 7:12:11 AM (ET

## 2020-09-02 ENCOUNTER — Telehealth: Payer: Self-pay | Admitting: Pulmonary Disease

## 2020-09-02 DIAGNOSIS — G4733 Obstructive sleep apnea (adult) (pediatric): Secondary | ICD-10-CM

## 2020-09-02 NOTE — Telephone Encounter (Signed)
RA pt is requesting that we send a note to [facility] to let them know that it is ok for her to return the cpap machine and use her settings on her old BIPAP machine.  Please advise if you are ok with her doing this.  Thanks

## 2020-09-08 NOTE — Telephone Encounter (Signed)
Order has been placed for pt to return her cpap to [facility].

## 2020-09-09 ENCOUNTER — Telehealth: Payer: Self-pay | Admitting: Pulmonary Disease

## 2020-09-09 DIAGNOSIS — G4733 Obstructive sleep apnea (adult) (pediatric): Secondary | ICD-10-CM

## 2020-09-09 NOTE — Telephone Encounter (Signed)
Kelly Milch, MD  Lajoyce Lauber A, CMA; [facility] Triage Pool 1 hour ago (9:57 AM)    Auto CPAP 10 to 20 cm  EPR +3 cm   Please obtain BiPAP download from current machine if available     Attempted to call pt to discuss this with her but unable  to reach. Left message for her to return call.  Order has been pended for pt to be switched back to CPAP from BIPAP. Once we are able to reach pt, we can then place the order.

## 2020-09-09 NOTE — Telephone Encounter (Signed)
Pt returning a phone call. Pt can be reached at [phone].

## 2020-09-09 NOTE — Telephone Encounter (Signed)
Pt returned call. Stated to pt that Dr. Vassie Loll was fine with [place] placing the order to have her switched back to cpap and she verbalized understanding. Order placed. Nothing further needed.

## 2020-09-09 NOTE — Telephone Encounter (Signed)
Spoke to [place], RT with [facility]. [name] stated that patient would like to switch back to cpap machine, as bipap is not affordable for her. Patient would like to proceed with cpap if Dr. Vassie Loll is agreeable.   Dr. Vassie Loll, please advise. Thanks

## 2020-09-09 NOTE — Addendum Note (Signed)
Addended by: Pilar Grammes on: 09/09/2020 09:20 AM   Modules accepted: Orders

## 2020-09-09 NOTE — Telephone Encounter (Signed)
Lm for [employer].

## 2020-09-09 NOTE — Addendum Note (Signed)
Addended by: Pilar Grammes on: 09/09/2020 09:23 AM   Modules accepted: Orders

## 2020-09-17 ENCOUNTER — Ambulatory Visit: Payer: BC Managed Care – PPO | Admitting: Pulmonary Disease

## 2020-09-17 ENCOUNTER — Other Ambulatory Visit: Payer: Self-pay

## 2020-09-17 ENCOUNTER — Encounter: Payer: Self-pay | Admitting: Pulmonary Disease

## 2020-09-17 VITALS — BP 108/70 | HR 66 | Temp 98.0°F | Ht 65.0 in | Wt 153.6 lb

## 2020-09-17 DIAGNOSIS — Z9989 Dependence on other enabling machines and devices: Secondary | ICD-10-CM | POA: Diagnosis not present

## 2020-09-17 DIAGNOSIS — G4733 Obstructive sleep apnea (adult) (pediatric): Secondary | ICD-10-CM | POA: Diagnosis not present

## 2020-09-17 NOTE — Progress Notes (Signed)
   Subjective:    Patient ID: [name], female    DOB: September 04, 1980, 40 y.o.   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 Last seen by me 04/2017 and by APP 12/2019.  She was on auto CPAP but had residual events and on a subsequent titration study seem to require BiPAP at high pressures 23/19 cm.  Was placed on auto BiPAP with good results but this was too expensive hence reverted to CPAP about a week ago  BiPAP download was reviewed which showed residual AHI of 9/hour some obstructives on average pressure of 19/15 cm and maximum pressure of 23/19 On auto CPAP 10 to 20 cm download was reviewed for the past week which shows average pressure of 19 cm with a residual AHI of 13/hour and small leak  She feels well rested.  BiPAP was expensive and CPAP is cheaper, since this was approved on her insurance.  Compliance is good.  She can do the long drive to [facility]  Significant tests/ events reviewed  NPSG 03/2017 AHI 68/hr   BiPAP titration study July 2021 with optimal control on BiPAP-IPAP 23 cm H2O, EPAP 19 cm H2O (no supplemental oxygen required during study)-AHI at optimal pressure 2.3  Review of Systems neg for any significant sore throat, dysphagia, itching, sneezing, nasal congestion or excess/ purulent secretions, fever, chills, sweats, unintended wt loss, pleuritic or exertional cp, hempoptysis, orthopnea pnd or change in chronic leg swelling. Also denies presyncope, palpitations, heartburn, abdominal pain, nausea, vomiting, diarrhea or change in bowel or urinary habits, dysuria,hematuria, rash, arthralgias, visual complaints, headache, numbness weakness or ataxia.     Objective:   Physical Exam  Gen. Pleasant,  in no distress ENT - no lesions, no post nasal drip, class 2 airway Neck: No JVD, no thyromegaly, no carotid bruits Lungs: no use of accessory muscles, no dullness to percussion, decreased without rales or rhonchi   Cardiovascular: Rhythm regular, heart sounds  normal, no murmurs or gallops, no peripheral edema Musculoskeletal: No deformities, no cyanosis or clubbing , no tremors       Assessment & Plan:

## 2020-09-17 NOTE — Patient Instructions (Signed)
Increase auto CPAP settings to 14-20 Repeat download in  1 month

## 2020-09-17 NOTE — Assessment & Plan Note (Signed)
She does seem to require a high pressure of 19 to 20 cm on CPAP, we will change her to auto settings of 14 to 20 cm hopeful that we can get her AHI within the 10 range which would be more acceptable.  If residual AHI remains high then we may have to consider getting BiPAP approval through her insurance We also discussed inspire device as an alternative to CPAP.  She would not want mandibular reconstruction surgery  Weight loss encouraged, compliance with goal of at least 4-6 hrs every night is the expectation. Advised against medications with sedative side effects Cautioned against driving when sleepy - understanding that sleepiness will vary on a day to day basis

## 2020-11-03 ENCOUNTER — Encounter: Payer: BC Managed Care – PPO | Admitting: Family

## 2020-11-06 ENCOUNTER — Other Ambulatory Visit: Payer: Self-pay

## 2020-11-06 ENCOUNTER — Ambulatory Visit (INDEPENDENT_AMBULATORY_CARE_PROVIDER_SITE_OTHER): Payer: BC Managed Care – PPO | Admitting: Family

## 2020-11-06 ENCOUNTER — Encounter: Payer: Self-pay | Admitting: Family

## 2020-11-06 VITALS — BP 114/72 | HR 78 | Temp 98.0°F | Resp 15 | Ht 65.0 in | Wt 154.0 lb

## 2020-11-06 DIAGNOSIS — D500 Iron deficiency anemia secondary to blood loss (chronic): Secondary | ICD-10-CM

## 2020-11-06 DIAGNOSIS — Z0000 Encounter for general adult medical examination without abnormal findings: Secondary | ICD-10-CM

## 2020-11-06 LAB — CBC WITH DIFFERENTIAL/PLATELET
Basophils Absolute: 0 10*3/uL (ref 0.0–0.1)
Basophils Relative: 0.4 % (ref 0.0–3.0)
Eosinophils Absolute: 0 10*3/uL (ref 0.0–0.7)
Eosinophils Relative: 0.6 % (ref 0.0–5.0)
HCT: 36.3 % (ref 36.0–46.0)
Hemoglobin: 11.5 g/dL — ABNORMAL LOW (ref 12.0–15.0)
Lymphocytes Relative: 38.7 % (ref 12.0–46.0)
Lymphs Abs: 2.6 10*3/uL (ref 0.7–4.0)
MCHC: 31.8 g/dL (ref 30.0–36.0)
MCV: 82.6 fl (ref 78.0–100.0)
Monocytes Absolute: 0.5 10*3/uL (ref 0.1–1.0)
Monocytes Relative: 7.2 % (ref 3.0–12.0)
Neutro Abs: 3.6 10*3/uL (ref 1.4–7.7)
Neutrophils Relative %: 53.1 % (ref 43.0–77.0)
Platelets: 208 10*3/uL (ref 150.0–400.0)
RBC: 4.4 Mil/uL (ref 3.87–5.11)
RDW: 16 % — ABNORMAL HIGH (ref 11.5–15.5)
WBC: 6.8 10*3/uL (ref 4.0–10.5)

## 2020-11-06 LAB — TSH: TSH: 1.59 u[IU]/mL (ref 0.35–5.50)

## 2020-11-06 LAB — LIPID PANEL
Cholesterol: 193 mg/dL (ref 0–200)
HDL: 44.1 mg/dL (ref >39.00)
LDL Cholesterol: 123 mg/dL — ABNORMAL HIGH (ref 0–99)
NonHDL: 148.41
Total CHOL/HDL Ratio: 4
Triglycerides: 128 mg/dL (ref 0.0–149.0)
VLDL: 25.6 mg/dL (ref 0.0–40.0)

## 2020-11-06 LAB — FERRITIN: Ferritin: 5.2 ng/mL — ABNORMAL LOW (ref 10.0–291.0)

## 2020-11-06 LAB — IRON: Iron: 99 ug/dL (ref 42–145)

## 2020-11-06 NOTE — Progress Notes (Signed)
Subjective:   By signing my name below, I, [name], attest that this documentation has been prepared under the direction and in the presence of [name] NP. 11/06/2020   Patient ID: [name], female    DOB: [birth year], 40 y.o.   MRN: [id number]  Chief Complaint  Patient presents with   Annual Exam    Sees gyn for pap    HPI Patient is in today for a comprehensive physical exam.   Covid-19: Patient has contracted Covid-19 prior.  Birth Control: She has a history of tubal litigation. Gastro Intestinal: She complains of constipation and has noted changing her diet and eating blueberries and spinach. She drinks 2-3 water bottles daily. Menstruation: She has noticed a heavy menstruation recently and a headache leading up to the start of her menstrual cycle. Patient denies having any unexpected weight change, ear pain, hearing loss and rhinorrhea, visual disturbance, cough, chest pain and leg swelling, nausea, vomiting, diarrhea and blood in stool, or dysuria and frequency, for myalgias and arthralgias, rash, adenopathy, depression or anxiety at this time.  No recent changes in family or surgical history. No tobacco use and minimal alcohol consumption. No drug use. Only one female partner.  Immunizations: She is UTD on tetanus vaccines. She has 3 Covid-19 vaccines at this time. Patient does not have an interest in receiving the flu vaccine. Diet: Patient is maintaining a healthy diet. Exercise: She does not participating in regular exercise.  Dental: She is UTD on her dental care. Vision: She is UTD on vision care. Pap Smear: Last performed on 08/24/2018 and results were negative. Repeat every 3 years.  Mammogram: Last performed on 11/18/2019 and no results were recorded.    Health Maintenance Due  Topic Date Due   Hepatitis C Screening  Never done    Past Medical History:  Diagnosis Date   Anxiety    Medical history non-contributory    OSA (obstructive sleep  apnea) '   Postpartum care following cesarean delivery 06/09/2012   S/P repeat low transverse C-section 06/09/2012   S/P tubal ligation 06/09/2012    Past Surgical History:  Procedure Laterality Date   CESAREAN SECTION  2006   CESAREAN SECTION N/A 06/08/2012   Procedure: CESAREAN SECTION;  Surgeon: Robley Fries, MD;  Location: [facility];  Service: Obstetrics;  Laterality: N/A;  Repeat C/S  EDD: 06/24/12;Twins Monochorionic diamniotic   TYMPANOPLASTY  2010   TYMPANOPLASTY W/ MASTOIDECTOMY Right 10/02/2018    Family History  Problem Relation Age of Onset   Pneumonia Father 71       died from pneumonia complications   Spondylitis Mother     Social History   Socioeconomic History   Marital status: Married    Spouse name: Not on file   Number of children: 3   Years of education: Not on file   Highest education level: Not on file  Occupational History   Occupation: Systems developer     Employer: [employer]  Tobacco Use   Smoking status: Never   Smokeless tobacco: Never  Vaping Use   Vaping Use: Never used  Substance and Sexual Activity   Alcohol use: Yes    Comment: Rarely    Drug use: No   Sexual activity: Yes    Partners: Male    Birth control/protection: Surgical  Other Topics Concern   Not on file  Social History Narrative   Has lived locally since 2013   3 children (twin girls borth 2014, 1 son 2006)  Works as a Conservation officer, historic buildings at [employer]   Enjoys spending time with family pets (one dog, 2 turtles, fish, kids)   Married   Mother-in-law lives with them   She has a Scientist, water quality (MBA in Education officer, environmental)   Grew up in [place] moved here at age 70 for school   (sister and mom live in [place])    Social Determinants of Corporate investment banker Strain: Not on [employer] Insecurity: Not on file  Transportation Needs: Not on file  Physical Activity: Not on file  Stress: Not on file  Social  Connections: Not on file  Intimate Partner Violence: Not on file    Outpatient Medications Prior to Visit  Medication Sig Dispense Refill   escitalopram (LEXAPRO) 20 MG tablet Take 20 mg by mouth daily.   0   LATUDA 40 MG TABS tablet Take 1 tablet by mouth daily.  1   No facility-administered medications prior to visit.    Allergies  Allergen Reactions   Venlafaxine Hives    Heart palpitations, headache, burning skin, shakiness, dry mouth, urinary issues,   Scopolamine Itching   Latex Itching and Rash   Nubain [Nalbuphine] Itching    Review of Systems  Constitutional:        (-) weight change (-)adenopathy  HENT:  Negative for ear pain and hearing loss.        (-) rhinorrhea   Eyes:        (-) vision changes  Respiratory:  Negative for cough.   Cardiovascular:  Negative for chest pain and leg swelling.  Gastrointestinal:  Positive for constipation. Negative for blood in stool, diarrhea, nausea and vomiting.  Genitourinary:  Negative for dysuria and frequency.  Musculoskeletal:  Negative for joint pain and myalgias.  Skin:  Negative for rash.  Neurological:  Positive for headaches.  Psychiatric/Behavioral:  Negative for depression. The patient is not nervous/anxious.       Objective:    Physical Exam Constitutional:      General: She is not in acute distress.    Appearance: Normal appearance. She is not ill-appearing.  HENT:     Head: Normocephalic and atraumatic.     Right Ear: Tympanic membrane, ear canal and external ear normal.     Left Ear: Tympanic membrane, ear canal and external ear normal.  Eyes:     Extraocular Movements: Extraocular movements intact.     Pupils: Pupils are equal, round, and reactive to light.     Comments: No nystagmus   Cardiovascular:     Rate and Rhythm: Normal rate and regular rhythm.     Heart sounds: Normal heart sounds. No murmur heard.   No gallop.  Pulmonary:     Effort: Pulmonary effort is normal. No respiratory distress.      Breath sounds: Normal breath sounds. No wheezing or rales.  Abdominal:     General: There is no distension.     Palpations: Abdomen is soft.     Tenderness: There is no abdominal tenderness. There is no guarding.  Musculoskeletal:     Comments: 5/5 strength in upper and lower extremities.   Lymphadenopathy:     Cervical: No cervical adenopathy.  Skin:    General: Skin is warm and dry.  Neurological:     Mental Status: She is alert and oriented to person, place, and time.     Deep Tendon Reflexes:     Reflex Scores:      Patellar reflexes are 3+  on the right side and 3+ on the left side. Psychiatric:        Behavior: Behavior normal.        Judgment: Judgment normal.    BP 114/72 (BP Location: Left Arm, Patient Position: Sitting, Cuff Size: Normal)   Pulse 78   Temp 98 F (36.7 C)   Resp 15   Ht 5\' 5"  (1.651 m)   Wt 154 lb (69.9 kg)   SpO2 99%   BMI 25.63 kg/m  Wt Readings from Last 3 Encounters:  11/06/20 154 lb (69.9 kg)  09/17/20 153 lb 9.6 oz (69.7 kg)  12/19/19 151 lb (68.5 kg)       Assessment & Plan:   Problem List Items Addressed This Visit       Unprioritized   Routine general medical examination at a health care facility    Encouraged her to continue her healthy diet and add regular weight loss. Mammo is scheduled. Pap is up to date with GYN.  Recommended new Covid Booster this fall when it becomes available.  Tetanus up to date. Declines flu shot today.       Other Visit Diagnoses     Preventative health care    -  Primary   Relevant Orders   TSH   Lipid panel   Iron deficiency anemia due to chronic blood loss       Relevant Orders   CBC with Differential/Platelet   Iron   Ferritin        No orders of the defined types were placed in this encounter.   I, 12/21/19 NP, personally preformed the services described in this documentation.  All medical record entries made by the scribe were at my direction and in my presence.  I  have reviewed the chart and discharge instructions (if applicable) and agree that the record reflects my personal performance and is accurate and complete. 11/06/2020   I,[name],acting as a scribe for 01/06/2021, NP.,have documented all relevant documentation on the behalf of Kelly Fillers, NP,as directed by  Kelly Fillers, NP while in the presence of Kelly Fillers, NP.    Kelly Fillers, NP

## 2020-11-06 NOTE — Patient Instructions (Signed)
Please complete lab work prior to leaving.  Try to add 30 minutes of walking 5 days a week.  

## 2020-11-06 NOTE — Assessment & Plan Note (Addendum)
Encouraged her to continue her healthy diet and add regular weight loss. Mammo is scheduled. Pap is up to date with GYN.  Recommended new Covid Booster this fall when it becomes available.  Tetanus up to date. Declines flu shot today.

## 2020-11-09 ENCOUNTER — Other Ambulatory Visit: Payer: Self-pay | Admitting: Family

## 2020-11-09 ENCOUNTER — Telehealth: Payer: Self-pay | Admitting: Family

## 2020-11-09 MED ORDER — MULTI-VITAMIN/MINERALS PO TABS: 1.0000 | ORAL_TABLET | Freq: Every day | ORAL | Status: AC

## 2020-11-09 NOTE — Telephone Encounter (Signed)
She still has mild anemia. I would recommend that she add mvi with minerals once daily.

## 2020-11-12 NOTE — Telephone Encounter (Signed)
Pt has called the office back and I have relayed the message from the provider. Pt stated understanding and will get one.

## 2020-11-12 NOTE — Telephone Encounter (Signed)
Lvm for patient to call back about this message from provider

## 2021-02-22 ENCOUNTER — Telehealth: Payer: Self-pay

## 2021-02-22 NOTE — Telephone Encounter (Signed)
Patient was scheduled for a virtual visit tomorrow. Advised to complete a home covid rapid test

## 2021-02-22 NOTE — Telephone Encounter (Signed)
urse Assessment Nurse: [name], RN, [name] Date/Time (Eastern Time): 02/21/2021 3:57:05 PM Confirm and document reason for call. If symptomatic, describe symptoms. ---Caller states she is not congested but she is having difficulty breathing through her nose, it is not completely blocked but does not work well enough so she is breathing through her mouth. This started few days ago. She also c/o HA when she wakes up and rates pain 6-7 on 0/10 pain scale. Denies fever or any other issues. Does the patient have any new or worsening symptoms? ---Yes Will a triage be completed? ---Yes Related visit to physician within the last 2 weeks? ---No Does the PT have any chronic conditions? (i.e. diabetes, asthma, this includes High risk factors for pregnancy, etc.) ---Yes List chronic conditions. ---Sleep apnea Is the patient pregnant or possibly pregnant? (Ask all females between the ages of 25-55) ---No Is this a behavioral health or substance abuse call? ---No PLEASE NOTE: All timestamps contained within this report are represented as [place] Standard Time. CONFIDENTIALTY NOTICE: This fax transmission is intended only for the addressee. It contains information that is legally privileged, confidential or otherwise protected from use or disclosure. If you are not the intended recipient, you are strictly prohibited from reviewing, disclosing, copying using or disseminating any of this information or taking any action in reliance on or regarding this information. If you have received this fax in error, please notify us immediately by telephone so that we can arrange for its return to [place]. Phone: [phone], Toll-Free: [phone], Fax: [phone] Page: 2 of 2 Call Id: [id number] Guidelines Guideline Title Affirmed Question Affirmed Notes Nurse Date/Time [name] Time) Headache [1] MODERATE headache (e.g., interferes with normal activities) AND [2] present > 24 hours AND [3]  unexplained (Exceptions: analgesics not tried, typical migraine, or headache part of viral illness) [name], RN, [name] 02/21/2021 4:00:35 PM Sinus Pain or Congestion [name], questions about [name] 02/21/2021 4:09:34 PM Disp. Time [name] Time) Disposition Final User 02/21/2021 4:04:08 PM See PCP within 24 Hours [name], RN, [name] 02/21/2021 4:12:14 PM Home Care Yes [name], RN, [name] Caller Disagree/Comply Comply Caller Understands Yes PreDisposition Did not know what to do Care Advice Given Per Guideline SEE PCP WITHIN 24 HOURS: * IF OFFICE WILL BE CLOSED: You need to be seen within the next 24 hours. A clinic or an urgent care center is often a good source of care if your doctor's office is closed or you can't get an appointment. PAIN MEDICINES: * IBUPROFEN (E.G., MOTRIN, ADVIL): Take 400 mg (two 200 mg pills) by mouth every 6 hours. The most you should take is 6 pills a day (1,200 mg total). * ACETAMINOPHEN - EXTRA STRENGTH TYLENOL: Take 1,000 mg (two 500 mg pills) every 6 to 8 hours as needed. Each Extra Strength Tylenol pill has 500 mg of acetaminophen. The most you should take is 6 pills a day (3,000 mg total). Note: In [place], the maximum is 8 pills a day (4,000 mg total). STRETCHING: * Stretch and massage any tight neck muscles. CALL BACK IF: * You become worse CARE ADVICE given per Headache (Adult) guideline. HOME CARE: * You should be able to treat this at home. NETI POT: CALL BACK IF: * You have more questions CARE ADVICE given per Sinus Pain or Congestion (Adult) guideline. Referrals REFERRED TO PCP OFFICE

## 2021-02-23 ENCOUNTER — Telehealth (INDEPENDENT_AMBULATORY_CARE_PROVIDER_SITE_OTHER): Payer: BC Managed Care – PPO | Admitting: Family

## 2021-02-23 DIAGNOSIS — R0981 Nasal congestion: Secondary | ICD-10-CM

## 2021-02-23 NOTE — Assessment & Plan Note (Signed)
Recommended nasal saline rinse, zyrtec 10mg  once daily/flonase 2 sprays each nostril once daily for next few weeks. Call if nasal congestion worsens or if not improved in 1 week. Would consider ENT referral given hx of what sounds like nasal polyp as a child.

## 2021-02-23 NOTE — Progress Notes (Addendum)
MyChart Video Visit    Virtual Visit via Video Note   This visit type was conducted due to national recommendations for restrictions regarding the COVID-19 Pandemic (e.g. social distancing) in an effort to limit this patient's exposure and mitigate transmission in our community. This patient is at least at moderate risk for complications without adequate follow up. This format is felt to be most appropriate for this patient at this time. Physical exam was limited by quality of the video and audio technology used for the visit. [name] was able to get the patient set up on a video visit.  Patient location: Home Patient and provider in visit Provider location: Office  I discussed the limitations of evaluation and management by telemedicine and the availability of in person appointments. The patient expressed understanding and agreed to proceed.  Visit Date: 02/23/2021  Today's healthcare provider: Lemont Fillers, NP     Subjective:    Patient ID: [name], female    DOB: 1980/09/08, 40 y.o.   MRN: [id number]  Chief Complaint  Patient presents with   Nasal Congestion    Patient complains of nasal congestion, tested negative for covid yesterday    HPI Patient is in today for a video visit.  Nasal congestion: She complains of nasal congestion on both sides of her nose. She notes that her nose is not running, but it just feels "stopped up." She mentions that she had an "internal growth" in her nose as a child and had to see an ENT. She has had no further issues since that time. Her nasal congestion bothers her when she wears her CPAP mask especially.    Past Medical History:  Diagnosis Date   Anxiety    Medical history non-contributory    OSA (obstructive sleep apnea) '   Postpartum care following cesarean delivery 06/09/2012   S/P repeat low transverse C-section 06/09/2012   S/P tubal ligation 06/09/2012    Past Surgical History:  Procedure Laterality Date   CESAREAN  SECTION  2006   CESAREAN SECTION N/A 06/08/2012   Procedure: CESAREAN SECTION;  Surgeon: Robley Fries, MD;  Location: [facility];  Service: Obstetrics;  Laterality: N/A;  Repeat C/S  EDD: 06/24/12;Twins Monochorionic diamniotic   TYMPANOPLASTY  2010   TYMPANOPLASTY W/ MASTOIDECTOMY Right 10/02/2018    Family History  Problem Relation Age of Onset   Pneumonia Father 46       died from pneumonia complications   Spondylitis Mother     Social History   Socioeconomic History   Marital status: Married    Spouse name: Not on file   Number of children: 3   Years of education: Not on file   Highest education level: Not on file  Occupational History   Occupation: Systems developer     Employer: [employer]  Tobacco Use   Smoking status: Never   Smokeless tobacco: Never  Vaping Use   Vaping Use: Never used  Substance and Sexual Activity   Alcohol use: Yes    Comment: Rarely    Drug use: No   Sexual activity: Yes    Partners: Male    Birth control/protection: Surgical  Other Topics Concern   Not on file  Social History Narrative   Has lived locally since 2013   3 children (twin girls borth 2014, 1 son 2006)   Works as a Conservation officer, historic buildings at [employer] +++++++++++++++++++++++++++++++++++++++++++++++++++++++++++++++++++++++++++++++++++++++++++++++   Enjoys spending time with family pets (one dog, 2 turtles, fish, kids)  Married   Mother-in-law lives with them   She has a Scientist, water quality (MBA in Education officer, environmental)   Grew up in [place] moved here at age 58 for school   (sister and mom live in [place])    Social Determinants of Health   Financial Resource Strain: Not on [employer] Insecurity: Not on file  Transportation Needs: Not on file  Physical Activity: Not on file  Stress: Not on file  Social Connections: Not on file  Intimate Partner Violence: Not on file    Outpatient Medications Prior to Visit  Medication Sig Dispense Refill   escitalopram (LEXAPRO) 20 MG tablet Take 20 mg by mouth daily.   0    LATUDA 40 MG TABS tablet Take 1 tablet by mouth daily.  1   Multiple Vitamins-Minerals (MULTIVITAMIN WITH MINERALS) tablet Take 1 tablet by mouth daily.     No facility-administered medications prior to visit.    Allergies  Allergen Reactions   Venlafaxine Hives    Heart palpitations, headache, burning skin, shakiness, dry mouth, urinary issues,   Scopolamine Itching   Latex Itching and Rash   Nubain [Nalbuphine] Itching    Review of Systems  HENT:  Positive for congestion. Negative for sore throat.        (+) rhinorrhea   Respiratory:  Negative for cough.       Objective:    Physical Exam Constitutional:      General: She is not in acute distress.    Appearance: Normal appearance. She is not ill-appearing.  HENT:     Head: Normocephalic and atraumatic.     Right Ear: External ear normal.     Left Ear: External ear normal.  Neurological:     Mental Status: She is alert.  Psychiatric:        Behavior: Behavior normal.        Judgment: Judgment normal.    There were no vitals taken for this visit. Wt Readings from Last 3 Encounters:  11/06/20 154 lb (69.9 kg)  09/17/20 153 lb 9.6 oz (69.7 kg)  12/19/19 151 lb (68.5 kg)    Diabetic Foot Exam - Simple   No data filed    Lab Results  Component Value Date   WBC 6.8 11/06/2020   HGB 11.5 (L) 11/06/2020   HCT 36.3 11/06/2020   PLT 208.0 11/06/2020   GLUCOSE 89 10/02/2018   CHOL 193 11/06/2020   TRIG 128.0 11/06/2020   HDL 44.10 11/06/2020   LDLCALC 123 (H) 11/06/2020   ALT 10 10/02/2018   AST 16 10/02/2018   NA 137 10/02/2018   K 4.2 10/02/2018   CL 102 10/02/2018   CREATININE 0.95 10/02/2018   BUN 15 10/02/2018   CO2 28 10/02/2018   TSH 1.59 11/06/2020    Lab Results  Component Value Date   TSH 1.59 11/06/2020   Lab Results  Component Value Date   WBC 6.8 11/06/2020   HGB 11.5 (L) 11/06/2020   HCT 36.3 11/06/2020   MCV 82.6 11/06/2020   PLT 208.0 11/06/2020   Lab Results  Component Value  Date   NA 137 10/02/2018   K 4.2 10/02/2018   CO2 28 10/02/2018   GLUCOSE 89 10/02/2018   BUN 15 10/02/2018   CREATININE 0.95 10/02/2018   BILITOT 0.3 10/02/2018   ALKPHOS 67 10/02/2018   AST 16 10/02/2018   ALT 10 10/02/2018   PROT 7.1 10/02/2018   ALBUMIN 4.3 10/02/2018   CALCIUM 9.3 10/02/2018   ANIONGAP  7 12/27/2014   GFR 65.30 10/02/2018   Lab Results  Component Value Date   CHOL 193 11/06/2020   Lab Results  Component Value Date   HDL 44.10 11/06/2020   Lab Results  Component Value Date   LDLCALC 123 (H) 11/06/2020   Lab Results  Component Value Date   TRIG 128.0 11/06/2020   Lab Results  Component Value Date   CHOLHDL 4 11/06/2020   No results found for: HGBA1C     Assessment & Plan:   Problem List Items Addressed This Visit       Unprioritized   Nasal congestion - Primary    Recommended nasal saline rinse, zyrtec 10mg  once daily/flonase 2 sprays each nostril once daily for next few weeks. Call if nasal congestion worsens or if not improved in 1 week. Would consider ENT referral given hx of what sounds like nasal polyp as a child.       No orders of the defined types were placed in this encounter.   I discussed the assessment and treatment plan with the patient. The patient was provided an opportunity to ask questions and all were answered. The patient agreed with the plan and demonstrated an understanding of the instructions.   The patient was advised to call back or seek an in-person evaluation if the symptoms worsen or if the condition fails to improve as anticipated.  I,Lyric Barr-McArthur,acting as a for Neurosurgeon, NP.,have documented all relevant documentation on the behalf of [employer], NP,as directed by  Lemont Fillers, NP while in the presence of Lemont Fillers, NP.  Lemont Fillers, NP Lemont Fillers at [employer] [phone] (phone) [phone] (fax)  [facility]

## 2021-02-23 NOTE — Patient Instructions (Signed)
Begin Flonase 2 sprays in each nostril daily.  Begin 10 mg Zyrtec daily.  If symptoms worsen or do not improve, please send me a MyChart message so I can make a referral to an ENT specialist.

## 2021-02-26 ENCOUNTER — Ambulatory Visit: Payer: BC Managed Care – PPO | Admitting: Family

## 2021-03-25 ENCOUNTER — Other Ambulatory Visit: Payer: Self-pay

## 2021-03-25 ENCOUNTER — Ambulatory Visit: Payer: BC Managed Care – PPO | Admitting: Adult Health

## 2021-03-25 ENCOUNTER — Encounter: Payer: Self-pay | Admitting: Adult Health

## 2021-03-25 DIAGNOSIS — G4733 Obstructive sleep apnea (adult) (pediatric): Secondary | ICD-10-CM | POA: Diagnosis not present

## 2021-03-25 NOTE — Addendum Note (Signed)
Addended by: Vanessa Barbara on: 03/25/2021 10:31 AM   Modules accepted: Orders

## 2021-03-25 NOTE — Patient Instructions (Signed)
Change CPAP to BIPAP (Auto-BiPAP - EPAP min 12, PS +4, IPAP max 23  )  If insurance will not cover will need to go for titration study .  Continue to work on healthy weight Do not drive if sleepy Follow-up in 3 months with Dr. Elsworth Soho  And As needed

## 2021-03-25 NOTE — Assessment & Plan Note (Signed)
Severe obstructive sleep apnea on nocturnal CPAP.  Patient continues to have residual events despite excellent compliance on CPAP.  She also has ongoing daytime sleepiness, daytime fatigue and headaches.  Previously BiPAP titration study showed significant decrease in sleep apneic events.  However patient's insurance would not cover and patient has been on CPAP.  She now says her insurance deductible is affordable and would like to proceed and change to BiPAP.  Will change CPAP to BiPAP with the following settings Auto-BiPAP - EPAP min12, PS +4, IPAP max 23(see previous BiPAP titration study)  Plan  Patient Instructions  Change CPAP to BIPAP (Auto-BiPAP - EPAP min 12, PS +4, IPAP max 23  )  If insurance will not cover will need to go for titration study .  Continue to work on healthy weight Do not drive if sleepy Follow-up in 3 months with Dr. Vassie Loll  And As needed

## 2021-03-25 NOTE — Progress Notes (Addendum)
@Patient  ID: [name], female    DOB: April 28, 1980, 41 y.o.   MRN: [id number]  Chief Complaint  Patient presents with   Follow-up    Referring provider: Debbrah Alar, NP  HPI: 41 year old female followed for obstructive sleep apnea [employer] business [employer] with [facility]  TEST/EVENTS :  Home sleep study January 2019 AHI 68/hr   BiPAP titration study July 2021 with optimal control on BiPAP-IPAP 23 cm H2O, EPAP 19 cm H2O (no supplemental oxygen required during study)-AHI at optimal pressure 2.3  03/25/2021 Follow up : OSA  Patient presents for a 72-month follow-up.  Patient has underlying severe obstructive sleep apnea.  Patient has previously required high CPAP pressures and residual eents. She was sent for a BiPAP titration study unfortunately BiPAP was too expensive.  She remains on CPAP .   Patient says she tries to wear her CPAP every single night.  For usually about 7 hours.  Patient says she still feels sleepy in am and unrested. Has a headache in am some mornings.   CPAP download shows excellent compliance with 93% usage.  Daily average usage at 7 hours.  Patient is on auto CPAP 14 to 20 cm H2O.  Daily average pressure at 19.8 cm H2O.  AHI is 14.4/hour. Previously insurance would not cover BiPAP.  Patient says that she would like to proceed with BiPAP as she did better on this and is still having ongoing daytime sleepiness headaches and daytime fatigue. She says now has a deductible with her insurance and is able to meet this.   Allergies  Allergen Reactions   Venlafaxine Hives    Heart palpitations, headache, burning skin, shakiness, dry mouth, urinary issues,   Scopolamine Itching   Latex Itching and Rash   Nubain [Nalbuphine] Itching    Immunization History  Administered Date(s) Administered   Influenza Split 12/12/2011   Influenza,inj,Quad PF,6+ Mos 03/27/2013, 12/13/2016   PFIZER(Purple Top)SARS-COV-2 Vaccination 05/16/2019, 06/12/2019, 01/21/2020   Tdap  04/23/2012    Past Medical History:  Diagnosis Date   Anxiety    Medical history non-contributory    OSA (obstructive sleep apnea) '   Postpartum care following cesarean delivery 06/09/2012   S/P repeat low transverse C-section 06/09/2012   S/P tubal ligation 06/09/2012    Tobacco History: Social History   Tobacco Use  Smoking Status Never  Smokeless Tobacco Never   Counseling given: Not Answered   Outpatient Medications Prior to Visit  Medication Sig Dispense Refill   escitalopram (LEXAPRO) 20 MG tablet Take 20 mg by mouth daily.   0   LATUDA 40 MG TABS tablet Take 1 tablet by mouth daily.  1   Multiple Vitamins-Minerals (MULTIVITAMIN WITH MINERALS) tablet Take 1 tablet by mouth daily.     No facility-administered medications prior to visit.     Review of Systems:   Constitutional:   No  weight loss, night sweats,  Fevers, chills,  +fatigue, or  lassitude.  HEENT:   No headaches,  Difficulty swallowing,  Tooth/dental problems, or  Sore throat,                No sneezing, itching, ear ache, nasal congestion, post nasal drip,   CV:  No chest pain,  Orthopnea, PND, swelling in lower extremities, anasarca, dizziness, palpitations, syncope.   GI  No heartburn, indigestion, abdominal pain, nausea, vomiting, diarrhea, change in bowel habits, loss of appetite, bloody stools.   Resp: No shortness of breath with exertion or at rest.  No excess  mucus, no productive cough,  No non-productive cough,  No coughing up of blood.  No change in color of mucus.  No wheezing.  No chest wall deformity  Skin: no rash or lesions.  GU: no dysuria, change in color of urine, no urgency or frequency.  No flank pain, no hematuria   MS:  No joint pain or swelling.  No decreased range of motion.  No back pain.    Physical Exam  BP 100/60 (BP Location: Left Arm, Patient Position: Sitting, Cuff Size: Normal)    Pulse 71    Temp 98.2 F (36.8 C) (Oral)    Ht 5\' 5"  (1.651 m)    Wt 155 lb 12.8 oz  (70.7 kg)    SpO2 98%    BMI 25.93 kg/m   GEN: A/Ox3; pleasant , NAD, well nourished    HEENT:  [place]/AT,  nl, NOSE-clear, THROAT-clear, no lesions, no postnasal drip or exudate noted.  Class 3 MP airway , recessed chin   NECK:  Supple w/ fair ROM; no JVD; normal carotid impulses w/o bruits; no thyromegaly or nodules palpated; no lymphadenopathy.    RESP  Clear  P & A; w/o, wheezes/ rales/ or rhonchi. no accessory muscle use, no dullness to percussion  CARD:  RRR, no m/r/g, no peripheral edema, pulses intact, no cyanosis or clubbing.  GI:   Soft & nt; nml bowel sounds; no organomegaly or masses detected.   Musco: Warm bil, no deformities or joint swelling noted.   Neuro: alert, no focal deficits noted.    Skin: Warm, no lesions or rashes    Lab Results:  CBC     BNP No results found for: BNP  ProBNP No results found for: PROBNP  Imaging: No results found.    No flowsheet data found.  No results found for: NITRICOXIDE      Assessment & Plan:   OSA (obstructive sleep apnea) Severe obstructive sleep apnea on nocturnal CPAP.  Patient continues to have residual events despite excellent compliance on CPAP.  She also has ongoing daytime sleepiness, daytime fatigue and headaches.  Previously BiPAP titration study showed significant decrease in sleep apneic events.  However patient's insurance would not cover and patient has been on CPAP.  She now says her insurance deductible is affordable and would like to proceed and change to BiPAP.  Will change CPAP to BiPAP with the following settings Auto-BiPAP - EPAP min 12, PS +4, IPAP max 23  (see previous BiPAP titration study)  Plan  Patient Instructions  Change CPAP to BIPAP (Auto-BiPAP - EPAP min 12, PS +4, IPAP max 23  )  If insurance will not cover will need to go for titration study .  Continue to work on healthy weight Do not drive if sleepy Follow-up in 3 months with Dr. Elsworth Soho  And As needed          I spent    30 minutes dedicated to the care of this patient on the date of this encounter to include pre-visit review of records, face-to-face time with the patient discussing conditions above, post visit ordering of testing, clinical documentation with the electronic health record, making appropriate referrals as documented, and communicating necessary findings to members of the patients care team.    Rexene Edison, NP 03/25/2021

## 2021-07-09 ENCOUNTER — Encounter: Payer: Self-pay | Admitting: Family

## 2021-07-09 ENCOUNTER — Telehealth: Payer: Self-pay | Admitting: Family

## 2021-07-09 ENCOUNTER — Ambulatory Visit (INDEPENDENT_AMBULATORY_CARE_PROVIDER_SITE_OTHER): Payer: BC Managed Care – PPO | Admitting: Family

## 2021-07-09 VITALS — BP 100/60 | HR 77 | Temp 98.5°F | Ht 65.0 in | Wt 158.6 lb

## 2021-07-09 DIAGNOSIS — R635 Abnormal weight gain: Secondary | ICD-10-CM

## 2021-07-09 DIAGNOSIS — D649 Anemia, unspecified: Secondary | ICD-10-CM

## 2021-07-09 DIAGNOSIS — Z0000 Encounter for general adult medical examination without abnormal findings: Secondary | ICD-10-CM | POA: Diagnosis not present

## 2021-07-09 DIAGNOSIS — E663 Overweight: Secondary | ICD-10-CM | POA: Diagnosis not present

## 2021-07-09 LAB — LIPID PANEL
Cholesterol: 175 mg/dL (ref 0–200)
HDL: 39 mg/dL — ABNORMAL LOW (ref >39.00)
LDL Cholesterol: 114 mg/dL — ABNORMAL HIGH (ref 0–99)
NonHDL: 136.37
Total CHOL/HDL Ratio: 4
Triglycerides: 114 mg/dL (ref 0.0–149.0)
VLDL: 22.8 mg/dL (ref 0.0–40.0)

## 2021-07-09 LAB — CBC WITH DIFFERENTIAL/PLATELET
Basophils Absolute: 0 10*3/uL (ref 0.0–0.1)
Basophils Relative: 0.4 % (ref 0.0–3.0)
Eosinophils Absolute: 0 10*3/uL (ref 0.0–0.7)
Eosinophils Relative: 0.7 % (ref 0.0–5.0)
HCT: 36.2 % (ref 36.0–46.0)
Hemoglobin: 11.7 g/dL — ABNORMAL LOW (ref 12.0–15.0)
Lymphocytes Relative: 29.8 % (ref 12.0–46.0)
Lymphs Abs: 2.1 10*3/uL (ref 0.7–4.0)
MCHC: 32.2 g/dL (ref 30.0–36.0)
MCV: 82.2 fl (ref 78.0–100.0)
Monocytes Absolute: 0.4 10*3/uL (ref 0.1–1.0)
Monocytes Relative: 5.8 % (ref 3.0–12.0)
Neutro Abs: 4.5 10*3/uL (ref 1.4–7.7)
Neutrophils Relative %: 63.3 % (ref 43.0–77.0)
Platelets: 226 10*3/uL (ref 150.0–400.0)
RBC: 4.4 Mil/uL (ref 3.87–5.11)
RDW: 15.2 % (ref 11.5–15.5)
WBC: 7.1 10*3/uL (ref 4.0–10.5)

## 2021-07-09 LAB — IRON: Iron: 45 ug/dL (ref 42–145)

## 2021-07-09 LAB — FERRITIN: Ferritin: 6.7 ng/mL — ABNORMAL LOW (ref 10.0–291.0)

## 2021-07-09 LAB — TSH: TSH: 1.1 u[IU]/mL (ref 0.35–5.50)

## 2021-07-09 MED ORDER — IRON (FERROUS SULFATE) 325 (65 FE) MG PO TABS: 325.0000 mg | ORAL_TABLET | ORAL | Status: AC

## 2021-07-09 NOTE — Patient Instructions (Signed)
Please complete lab work prior to leaving. ?Please get your bivalent covid booster at the pharmacy. ? ?

## 2021-07-09 NOTE — Telephone Encounter (Signed)
Records release request faxed to [facility] ?

## 2021-07-09 NOTE — Assessment & Plan Note (Addendum)
Wt Readings from Last 3 Encounters:  ?07/09/21 158 lb 9.6 oz (71.9 kg)  ?03/25/21 155 lb 12.8 oz (70.7 kg)  ?11/06/20 154 lb (69.9 kg)  ? ?Discussed healthy diet, exercise, weight loss. Request copy of mammogram from GYN.  She will schedule pap with GYN. Encouraged her to consider the bivalent Covid booster at her pharmacy.  ?

## 2021-07-09 NOTE — Telephone Encounter (Signed)
She is still mildly anemic and iron a bit low. I would recommend iron 325mg  by mouth every other day (OTC) Repeat ferritin, serum iron, cbc in 3 months. Dx iron deficiency anemia.  ?

## 2021-07-09 NOTE — Telephone Encounter (Signed)
Pt called back and was given results and provider recommendations. ?

## 2021-07-09 NOTE — Telephone Encounter (Signed)
Please call [facility] to request copy of last mammogram.  ?

## 2021-07-09 NOTE — Telephone Encounter (Signed)
Lvm  for patient to call back about results. 

## 2021-07-09 NOTE — Progress Notes (Addendum)
? ?Subjective:  ? ?By signing my name below, I, [name], attest that this documentation has been prepared under the direction and in the presence of [name], 07/09/2021   ? ? Patient ID: [name], female    DOB: 1980/07/24, 41 y.o.   MRN: [id number] ? ?Chief Complaint  ?Patient presents with  ? Annual Exam  ?  CPE- not fasting cream in coffee  ? ? ?HPI ?Patient is in today for a comprehensive physical exam.  ? ?Cholesterol - Her cholesterol levels are not bad. ?Lab Results  ?Component Value Date  ? CHOL 193 11/06/2020  ? HDL 44.10 11/06/2020  ? LDLCALC 123 (H) 11/06/2020  ? TRIG 128.0 11/06/2020  ? CHOLHDL 4 11/06/2020  ? ?Menstruation - Her heavy periods alternate. She used to take iron supplements. She experiences hormonal headaches during the onset of menstruation. Medication helps her headache symptoms.  ?Lab Results  ?Component Value Date  ? IRON 99 11/06/2020  ? TIBC 418 11/25/2019  ? FERRITIN 5.2 (L) 11/06/2020  ? ?Diabetes - She denies of any diabetes in her family.  ? ?Digestion - Her digestion has been worsening. She occasionally gets reflux.  ? ?Mid Back Pain - During cold and windy weathers, she tends to get mid back pain.  ? ?She denies having any fever, ear pain, new muscle pain, joint pain, new moles, congestion, sinus pain, sore throat, palpations, wheezing, n/v/d, constipation, blood in stool, dysuria, frequency, hematuria, depresssion or anxiety at this time. ? ?Social History: She reports no recent surgeries. She denies changes to her family medical history.  ?Pap Smear: Last completed on 08/24/2018. She plans to complete a pap smear this year.  ?Mammogram: Last completed on file on 11/18/2019. She reports to have received a mammogram screening last year.  ?Immunizations: Her last tetanus was in 2014. She did not get the influenza vaccine. She has received 3 COVID 19 vaccines. She is not interested in a HepC screening.  ?Diet: She is trying to maintain a healthy diet.   ?Exercise: Due to her occupation, she exercises at home when she can. ?Vision: She is UTD on vision exams.  ? ? ?Health Maintenance Due  ?Topic Date Due  ? COVID-19 Vaccine (4 - Booster for Pfizer series) 03/17/2020  ? PAP SMEAR-Modifier  08/23/2021  ? ? ?Past Medical History:  ?Diagnosis Date  ? Anxiety   ? Medical history non-contributory   ? OSA (obstructive sleep apnea) '  ? Postpartum care following cesarean delivery 06/09/2012  ? S/P repeat low transverse C-section 06/09/2012  ? S/P tubal ligation 06/09/2012  ? ? ?Past Surgical History:  ?Procedure Laterality Date  ? CESAREAN SECTION  2006  ? CESAREAN SECTION N/A 06/08/2012  ? Procedure: CESAREAN SECTION;  Surgeon: Robley Fries, MD;  Location: [facility];  Service: Obstetrics;  Laterality: N/A;  Repeat C/S  EDD: 06/24/12;Twins Monochorionic diamniotic  ? TYMPANOPLASTY  2010  ? TYMPANOPLASTY W/ MASTOIDECTOMY Right 10/02/2018  ? ? ?Family History  ?Problem Relation Age of Onset  ? Pneumonia Father 45  ?     died from pneumonia complications  ? Spondylitis Mother   ? ? ?Social History  ? ?Socioeconomic History  ? Marital status: Married  ?  Spouse name: Not on file  ? Number of children: 3  ? Years of education: Not on file  ? Highest education level: Not on file  ?Occupational History  ? Occupation: Systems developer   ?  Employer: [employer]  ?Tobacco Use  ?  Smoking status: Never  ? Smokeless tobacco: Never  ?Vaping Use  ? Vaping Use: Never used  ?Substance and Sexual Activity  ? Alcohol use: Yes  ?  Comment: Rarely   ? Drug use: No  ? Sexual activity: Yes  ?  Partners: Male  ?  Birth control/protection: Surgical  ?Other Topics Concern  ? Not on file  ?Social History Narrative  ? Has lived locally since 2013  ? 3 children (twin girls borth 2014, 1 son 2006)  ? Works as a Conservation officer, historic buildings at [employer] +++++++++++++++++++++++++++++++++++++++++++++++++++++++++++++++++++++++++++++++++++++++++++++++  ? Enjoys spending time with family pets (one dog, 2 turtles, fish, kids)  ?  Married  ? Mother-in-law lives with them  ? She has a Scientist, water quality Company secretary in [place])  ? Grew up in [place] moved here at age 41 for school  ? (sister and mom live in [place])   ? ?Social Determinants of Health  ? ?Financial Resource Strain: Not on file  ?Food Insecurity: Not on file  ?Transportation Needs: Not on file  ?Physical Activity: Not on file  ?Stress: Not on file  ?Social Connections: Not on file  ?Intimate Partner Violence: Not on file  ? ? ?Outpatient Medications Prior to Visit  ?Medication Sig Dispense Refill  ? escitalopram (LEXAPRO) 20 MG tablet Take 20 mg by mouth daily.   0  ? LATUDA 40 MG TABS tablet Take 1 tablet by mouth daily.  1  ? Multiple Vitamins-Minerals (MULTIVITAMIN WITH MINERALS) tablet Take 1 tablet by mouth daily.    ? ?No facility-administered medications prior to visit.  ? ? ?Allergies  ?Allergen Reactions  ? Venlafaxine Hives  ?  Heart palpitations, headache, burning skin, shakiness, dry mouth, urinary issues,  ? Scopolamine Itching  ? Latex Itching and Rash  ? Nubain [Nalbuphine] Itching  ? ? ?Review of Systems  ?Constitutional:  Negative for fever.  ?HENT:  Negative for congestion, ear pain, sinus pain and sore throat.   ?Respiratory:  Negative for wheezing.   ?Cardiovascular:  Negative for palpitations.  ?Gastrointestinal:  Negative for blood in stool, constipation, diarrhea, nausea and vomiting.  ?Genitourinary:  Negative for dysuria, frequency and hematuria.  ?Musculoskeletal:  Positive for back pain (During Cold Weather). Negative for joint pain and myalgias.  ?Skin:  Negative for rash.  ?     (-) [place]  ?Neurological:  Positive for headaches (Hormonal).  ?Psychiatric/Behavioral:  Negative for depression. The patient is not nervous/anxious.   ? ?   ?Objective:  ?  ?Physical Exam ?Constitutional:   ?   General: She is not in acute distress. ?   Appearance: Normal appearance. She is not ill-appearing.  ?HENT:  ?   Head: Normocephalic and atraumatic.  ?   Right Ear: Ear canal and  external ear normal.  ?   Left Ear: Tympanic membrane, ear canal and external ear normal.  ?   Ears:  ?   Comments: Abnormal right TM ?Eyes:  ?   Extraocular Movements: Extraocular movements intact.  ?   Pupils: Pupils are equal, round, and reactive to light.  ?Cardiovascular:  ?   Rate and Rhythm: Normal rate and regular rhythm.  ?   Heart sounds: Normal heart sounds. No murmur heard. ?  No gallop.  ?Pulmonary:  ?   Effort: Pulmonary effort is normal. No respiratory distress.  ?   Breath sounds: Normal breath sounds. No wheezing or rales.  ?Abdominal:  ?   General: Bowel sounds are normal. There is no distension.  ?  Palpations: Abdomen is soft.  ?   Tenderness: There is no abdominal tenderness. There is no guarding.  ?Musculoskeletal:  ?   Comments: 5/5 strength in both upper and lower extremities  ?Skin: ?   General: Skin is warm and dry.  ?Neurological:  ?   Mental Status: She is alert and oriented to person, place, and time.  ?   Deep Tendon Reflexes:  ?   Reflex Scores: ?     Patellar reflexes are 3+ on the right side and 3+ on the left side. ?Psychiatric:     ?   Mood and Affect: Mood normal.     ?   Behavior: Behavior normal.     ?   Judgment: Judgment normal.  ? ? ?BP 100/60   Pulse 77   Temp 98.5 ?F (36.9 ?C) (Oral)   Ht 5\' 5"  (1.651 m)   Wt 158 lb 9.6 oz (71.9 kg)   LMP 06/29/2021   SpO2 99%   BMI 26.39 kg/m?  ?Wt Readings from Last 3 Encounters:  ?07/09/21 158 lb 9.6 oz (71.9 kg)  ?03/25/21 155 lb 12.8 oz (70.7 kg)  ?11/06/20 154 lb (69.9 kg)  ? ? ?   ?Assessment & Plan:  ? ?Problem List Items Addressed This Visit   ? ?  ? Unprioritized  ? Preventative health care - Primary  ?  Wt Readings from Last 3 Encounters:  ?07/09/21 158 lb 9.6 oz (71.9 kg)  ?03/25/21 155 lb 12.8 oz (70.7 kg)  ?11/06/20 154 lb (69.9 kg)  ?Discussed healthy diet, exercise, weight loss. Request copy of mammogram from GYN.  She will schedule pap with GYN. Encouraged her to consider the bivalent Covid booster at her pharmacy.   ?  ?  ? Relevant Orders  ? Lipid panel  ? ?Other Visit Diagnoses   ? ? Anemia, unspecified type      ? Relevant Orders  ? CBC with Differential/Platelet  ? Iron  ? Ferritin  ? Overweight      ? Relevant Orders  ? Lipid pa

## 2021-07-12 ENCOUNTER — Telehealth (INDEPENDENT_AMBULATORY_CARE_PROVIDER_SITE_OTHER): Payer: BC Managed Care – PPO | Admitting: Pulmonary Disease

## 2021-07-12 DIAGNOSIS — G4733 Obstructive sleep apnea (adult) (pediatric): Secondary | ICD-10-CM | POA: Diagnosis not present

## 2021-07-12 NOTE — Assessment & Plan Note (Signed)
BiPAP download was reviewed which shows residual AHI of 14/hour with average pressure of 23/19 cm with minimal leak.  Events do not appear to be central. ?We will increase minimum EPAP to 14 cm and hopefully this will narrow the BiPAP range and lead to better control of events.  What is reassuring is that she does not have significant residual hypersomnolence and feels much improved from baseline.  I would like to see AHI decrease further however  will accept less than 15/hour ? ?Weight loss encouraged, compliance with goal of at least 4-6 hrs every night is the expectation. ?Advised against medications with sedative side effects ?Cautioned against driving when sleepy - understanding that sleepiness will vary on a day to day basis ? ?

## 2021-07-12 NOTE — Progress Notes (Signed)
I connected with  [name] on 07/12/21 by   video enabled telemedicine application and verified that I am speaking with the correct person using two identifiers. ? ? ?  ?Location: ?Patient: Home ?Provider: [facility] - [address], [place], [place] [address] ?  ?I discussed the limitations of evaluation and management by telemedicine and the availability of in person appointments. The patient expressed understanding and agreed to proceed. I also discussed with the patient that there may be a patient responsible charge related to this service. The patient expressed understanding and agreed to proceed. ?  ?Patient consented to consult via telephone: Yes ?People present and their role in pt care: Pt ? ? 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 ? ?She was on auto CPAP but had residual events and on a subsequent titration study seem to require BiPAP at high pressures 23/19 cm.  Was placed on auto BiPAP with good results but this was too expensive hence reverted to CPAP  ?She required a high pressure of 19 to 20 cm on CPAP ? ?On her last office visit 03/2021 she was noted to have residual events on auto CPAP and hence changed to BiPAP which she could not afford. ?She reports feeling better rested, she is tolerating pressure okay, reports dryness of mouth.  She does use a fullface mask ?She is planning to move to [place] ? ?Significant tests/ events reviewed ? ?NPSG 03/2017 AHI 68/hr ?  ?BiPAP titration study July 2021 with optimal control on BiPAP-IPAP 23 cm H2O, EPAP 19 cm H2O (no supplemental oxygen required during study)-AHI at optimal pressure 2.3 ? ? ?On exam -no distress, no accessory muscle use, able to speak in full sentences, ? ? ?Total encounter time was 24 minutes ?

## 2021-07-12 NOTE — Patient Instructions (Addendum)
?  X Increase EPAP min to 14 cm ? ?Recheck download  in 1 month ? ?Trial of airfit F30 full face mask ?

## 2022-04-05 IMAGING — DX DG CHEST 2V
2 series · 2 of 2 positions shown · non-contrast
Comparison: None.

CLINICAL DATA: Cough

EXAM:
CHEST - 2 VIEW

[chest pa]
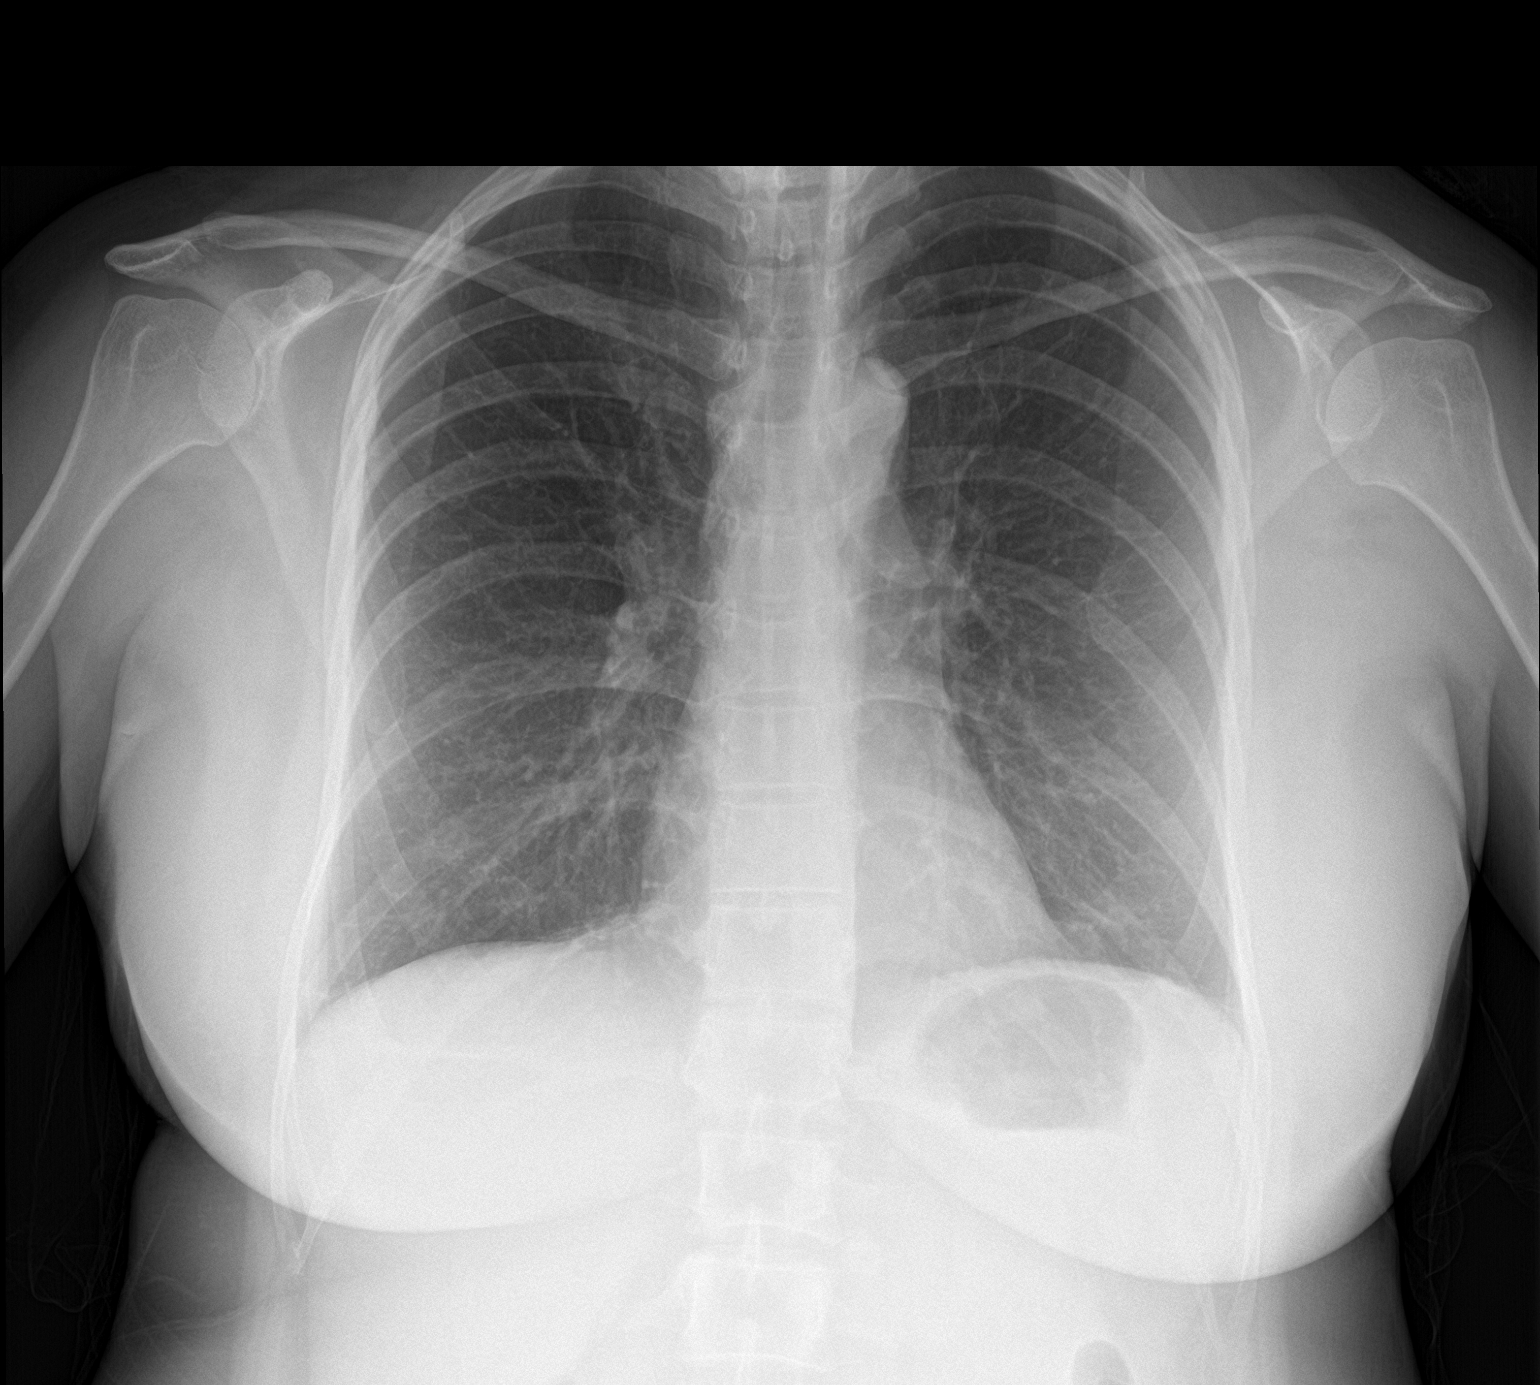

[chest lat]
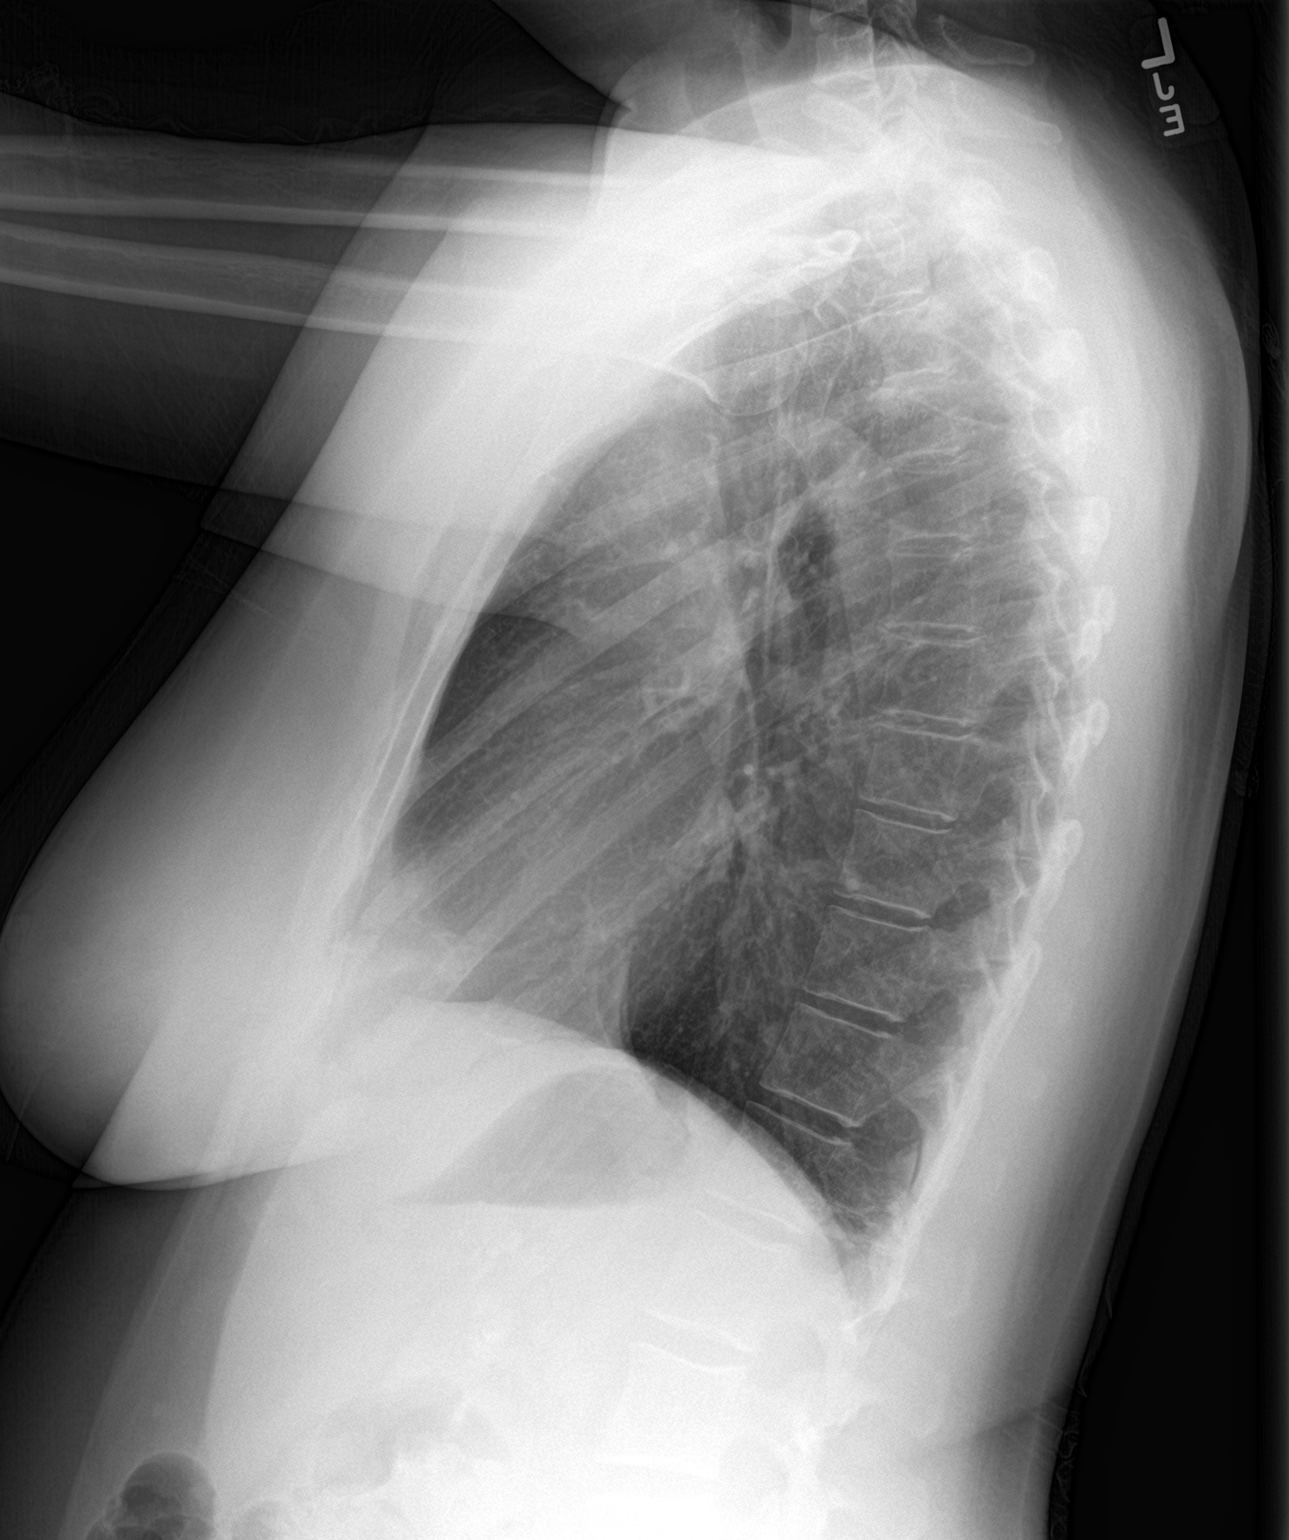

[2 of 2 positions shown; findings below may reference images not displayed]

FINDINGS: The heart size and mediastinal contours are within normal limits.
Both lungs are clear. The visualized skeletal structures are
unremarkable.
IMPRESSION: No active cardiopulmonary disease.

## 2022-04-12 ENCOUNTER — Telehealth (HOSPITAL_BASED_OUTPATIENT_CLINIC_OR_DEPARTMENT_OTHER): Payer: Self-pay | Admitting: Pulmonary Disease

## 2022-04-14 NOTE — Telephone Encounter (Signed)
We do not get auth's for DME companies this is done through [facility]

## 2022-04-23 NOTE — Telephone Encounter (Signed)
Did someone follow up with pt. To let her know what she needs to do to get this done so we can close the encounter

## 2022-04-25 NOTE — Telephone Encounter (Signed)
Lmam for patient letting her know to call [facility]
